# Patient Record
Sex: Female | Born: 1950 | Race: White | Hispanic: No | Marital: Single
Health system: Southern US, Community
[De-identification: ages and names within clinical notes are randomized; demographics above are authoritative.]

---

## 2017-09-06 DIAGNOSIS — G40909 Epilepsy, unspecified, not intractable, without status epilepticus: Secondary | ICD-10-CM | POA: Diagnosis not present

## 2017-09-06 DIAGNOSIS — E785 Hyperlipidemia, unspecified: Secondary | ICD-10-CM | POA: Diagnosis not present

## 2017-09-06 DIAGNOSIS — E039 Hypothyroidism, unspecified: Secondary | ICD-10-CM | POA: Diagnosis not present

## 2017-09-06 DIAGNOSIS — H6120 Impacted cerumen, unspecified ear: Secondary | ICD-10-CM | POA: Diagnosis not present

## 2017-09-06 DIAGNOSIS — Z681 Body mass index (BMI) 19 or less, adult: Secondary | ICD-10-CM | POA: Diagnosis not present

## 2017-09-06 DIAGNOSIS — Z9181 History of falling: Secondary | ICD-10-CM | POA: Diagnosis not present

## 2017-09-13 DIAGNOSIS — E039 Hypothyroidism, unspecified: Secondary | ICD-10-CM | POA: Diagnosis not present

## 2017-09-13 DIAGNOSIS — G40909 Epilepsy, unspecified, not intractable, without status epilepticus: Secondary | ICD-10-CM | POA: Diagnosis not present

## 2017-09-13 DIAGNOSIS — E785 Hyperlipidemia, unspecified: Secondary | ICD-10-CM | POA: Diagnosis not present

## 2017-10-12 DIAGNOSIS — Z1339 Encounter for screening examination for other mental health and behavioral disorders: Secondary | ICD-10-CM | POA: Diagnosis not present

## 2017-10-12 DIAGNOSIS — Z9181 History of falling: Secondary | ICD-10-CM | POA: Diagnosis not present

## 2017-10-12 DIAGNOSIS — Z139 Encounter for screening, unspecified: Secondary | ICD-10-CM | POA: Diagnosis not present

## 2017-10-12 DIAGNOSIS — E785 Hyperlipidemia, unspecified: Secondary | ICD-10-CM | POA: Diagnosis not present

## 2017-10-12 DIAGNOSIS — Z0000 Encounter for general adult medical examination without abnormal findings: Secondary | ICD-10-CM | POA: Diagnosis not present

## 2018-04-10 DIAGNOSIS — J209 Acute bronchitis, unspecified: Secondary | ICD-10-CM | POA: Diagnosis not present

## 2018-05-22 DIAGNOSIS — J209 Acute bronchitis, unspecified: Secondary | ICD-10-CM | POA: Diagnosis not present

## 2018-05-22 DIAGNOSIS — R079 Chest pain, unspecified: Secondary | ICD-10-CM | POA: Diagnosis not present

## 2018-05-22 DIAGNOSIS — R0602 Shortness of breath: Secondary | ICD-10-CM | POA: Diagnosis not present

## 2018-05-22 DIAGNOSIS — J440 Chronic obstructive pulmonary disease with acute lower respiratory infection: Secondary | ICD-10-CM | POA: Diagnosis not present

## 2018-05-22 DIAGNOSIS — F17210 Nicotine dependence, cigarettes, uncomplicated: Secondary | ICD-10-CM | POA: Diagnosis not present

## 2018-05-29 DIAGNOSIS — Z6820 Body mass index (BMI) 20.0-20.9, adult: Secondary | ICD-10-CM | POA: Diagnosis not present

## 2018-05-29 DIAGNOSIS — E782 Mixed hyperlipidemia: Secondary | ICD-10-CM | POA: Diagnosis not present

## 2018-05-29 DIAGNOSIS — J209 Acute bronchitis, unspecified: Secondary | ICD-10-CM | POA: Diagnosis not present

## 2018-10-24 DIAGNOSIS — Z139 Encounter for screening, unspecified: Secondary | ICD-10-CM | POA: Diagnosis not present

## 2018-10-24 DIAGNOSIS — Z1331 Encounter for screening for depression: Secondary | ICD-10-CM | POA: Diagnosis not present

## 2018-10-24 DIAGNOSIS — E785 Hyperlipidemia, unspecified: Secondary | ICD-10-CM | POA: Diagnosis not present

## 2018-10-24 DIAGNOSIS — Z0000 Encounter for general adult medical examination without abnormal findings: Secondary | ICD-10-CM | POA: Diagnosis not present

## 2018-10-24 DIAGNOSIS — Z9181 History of falling: Secondary | ICD-10-CM | POA: Diagnosis not present

## 2018-12-12 DIAGNOSIS — E785 Hyperlipidemia, unspecified: Secondary | ICD-10-CM | POA: Diagnosis not present

## 2018-12-12 DIAGNOSIS — G40909 Epilepsy, unspecified, not intractable, without status epilepticus: Secondary | ICD-10-CM | POA: Diagnosis not present

## 2018-12-12 DIAGNOSIS — E039 Hypothyroidism, unspecified: Secondary | ICD-10-CM | POA: Diagnosis not present

## 2019-05-20 DIAGNOSIS — R410 Disorientation, unspecified: Secondary | ICD-10-CM | POA: Diagnosis not present

## 2019-05-20 DIAGNOSIS — G9341 Metabolic encephalopathy: Secondary | ICD-10-CM | POA: Diagnosis not present

## 2019-05-20 DIAGNOSIS — R627 Adult failure to thrive: Secondary | ICD-10-CM | POA: Diagnosis not present

## 2019-05-20 DIAGNOSIS — R5383 Other fatigue: Secondary | ICD-10-CM | POA: Diagnosis not present

## 2019-05-20 DIAGNOSIS — E039 Hypothyroidism, unspecified: Secondary | ICD-10-CM | POA: Diagnosis not present

## 2019-05-20 DIAGNOSIS — E440 Moderate protein-calorie malnutrition: Secondary | ICD-10-CM | POA: Diagnosis not present

## 2019-05-20 DIAGNOSIS — R4182 Altered mental status, unspecified: Secondary | ICD-10-CM | POA: Diagnosis not present

## 2019-05-20 DIAGNOSIS — J9601 Acute respiratory failure with hypoxia: Secondary | ICD-10-CM | POA: Diagnosis not present

## 2019-05-20 DIAGNOSIS — E860 Dehydration: Secondary | ICD-10-CM | POA: Diagnosis not present

## 2019-05-20 DIAGNOSIS — R569 Unspecified convulsions: Secondary | ICD-10-CM | POA: Diagnosis not present

## 2019-05-20 DIAGNOSIS — J9811 Atelectasis: Secondary | ICD-10-CM | POA: Diagnosis not present

## 2019-05-20 DIAGNOSIS — J9602 Acute respiratory failure with hypercapnia: Secondary | ICD-10-CM | POA: Diagnosis not present

## 2019-05-20 DIAGNOSIS — R531 Weakness: Secondary | ICD-10-CM | POA: Diagnosis not present

## 2019-05-20 DIAGNOSIS — J90 Pleural effusion, not elsewhere classified: Secondary | ICD-10-CM | POA: Diagnosis not present

## 2019-05-21 DIAGNOSIS — E039 Hypothyroidism, unspecified: Secondary | ICD-10-CM | POA: Diagnosis not present

## 2019-05-21 DIAGNOSIS — R531 Weakness: Secondary | ICD-10-CM | POA: Diagnosis not present

## 2019-05-21 DIAGNOSIS — E860 Dehydration: Secondary | ICD-10-CM | POA: Diagnosis not present

## 2019-05-21 DIAGNOSIS — R569 Unspecified convulsions: Secondary | ICD-10-CM | POA: Diagnosis not present

## 2019-05-22 DIAGNOSIS — R531 Weakness: Secondary | ICD-10-CM | POA: Diagnosis not present

## 2019-05-22 DIAGNOSIS — E860 Dehydration: Secondary | ICD-10-CM | POA: Diagnosis not present

## 2019-05-22 DIAGNOSIS — R569 Unspecified convulsions: Secondary | ICD-10-CM | POA: Diagnosis not present

## 2019-05-22 DIAGNOSIS — E039 Hypothyroidism, unspecified: Secondary | ICD-10-CM | POA: Diagnosis not present

## 2019-05-23 DIAGNOSIS — E860 Dehydration: Secondary | ICD-10-CM | POA: Diagnosis not present

## 2019-05-23 DIAGNOSIS — R569 Unspecified convulsions: Secondary | ICD-10-CM | POA: Diagnosis not present

## 2019-05-23 DIAGNOSIS — R531 Weakness: Secondary | ICD-10-CM | POA: Diagnosis not present

## 2019-05-23 DIAGNOSIS — E039 Hypothyroidism, unspecified: Secondary | ICD-10-CM | POA: Diagnosis not present

## 2019-05-24 ENCOUNTER — Inpatient Hospital Stay (HOSPITAL_COMMUNITY): Payer: Medicare PPO

## 2019-05-24 ENCOUNTER — Inpatient Hospital Stay (HOSPITAL_COMMUNITY)
Admission: EM | Admit: 2019-05-24 | Discharge: 2019-05-30 | DRG: 871 | Disposition: A | Discharge status: Hospice | Payer: Medicare PPO | Source: Other Acute Inpatient Hospital | Attending: Emergency Medicine | Admitting: Emergency Medicine

## 2019-05-24 DIAGNOSIS — Z515 Encounter for palliative care: Secondary | ICD-10-CM | POA: Diagnosis not present

## 2019-05-24 DIAGNOSIS — Z66 Do not resuscitate: Secondary | ICD-10-CM | POA: Diagnosis present

## 2019-05-24 DIAGNOSIS — I10 Essential (primary) hypertension: Secondary | ICD-10-CM | POA: Diagnosis present

## 2019-05-24 DIAGNOSIS — E46 Unspecified protein-calorie malnutrition: Secondary | ICD-10-CM | POA: Diagnosis present

## 2019-05-24 DIAGNOSIS — Z820 Family history of epilepsy and other diseases of the nervous system: Secondary | ICD-10-CM | POA: Diagnosis not present

## 2019-05-24 DIAGNOSIS — A419 Sepsis, unspecified organism: Principal | ICD-10-CM | POA: Diagnosis present

## 2019-05-24 DIAGNOSIS — R251 Tremor, unspecified: Secondary | ICD-10-CM | POA: Diagnosis not present

## 2019-05-24 DIAGNOSIS — E43 Unspecified severe protein-calorie malnutrition: Secondary | ICD-10-CM | POA: Diagnosis present

## 2019-05-24 DIAGNOSIS — L8990 Pressure ulcer of unspecified site, unspecified stage: Secondary | ICD-10-CM | POA: Insufficient documentation

## 2019-05-24 DIAGNOSIS — R918 Other nonspecific abnormal finding of lung field: Secondary | ICD-10-CM | POA: Diagnosis not present

## 2019-05-24 DIAGNOSIS — G40009 Localization-related (focal) (partial) idiopathic epilepsy and epileptic syndromes with seizures of localized onset, not intractable, without status epilepticus: Secondary | ICD-10-CM | POA: Diagnosis not present

## 2019-05-24 DIAGNOSIS — Z9911 Dependence on respirator [ventilator] status: Secondary | ICD-10-CM | POA: Diagnosis not present

## 2019-05-24 DIAGNOSIS — R6521 Severe sepsis with septic shock: Secondary | ICD-10-CM | POA: Diagnosis present

## 2019-05-24 DIAGNOSIS — M1990 Unspecified osteoarthritis, unspecified site: Secondary | ICD-10-CM | POA: Diagnosis present

## 2019-05-24 DIAGNOSIS — J690 Pneumonitis due to inhalation of food and vomit: Secondary | ICD-10-CM | POA: Diagnosis present

## 2019-05-24 DIAGNOSIS — J984 Other disorders of lung: Secondary | ICD-10-CM | POA: Diagnosis not present

## 2019-05-24 DIAGNOSIS — G92 Toxic encephalopathy: Secondary | ICD-10-CM | POA: Diagnosis not present

## 2019-05-24 DIAGNOSIS — G40209 Localization-related (focal) (partial) symptomatic epilepsy and epileptic syndromes with complex partial seizures, not intractable, without status epilepticus: Secondary | ICD-10-CM | POA: Diagnosis not present

## 2019-05-24 DIAGNOSIS — Z79890 Hormone replacement therapy (postmenopausal): Secondary | ICD-10-CM | POA: Diagnosis not present

## 2019-05-24 DIAGNOSIS — E872 Acidosis: Secondary | ICD-10-CM | POA: Diagnosis present

## 2019-05-24 DIAGNOSIS — L89151 Pressure ulcer of sacral region, stage 1: Secondary | ICD-10-CM | POA: Diagnosis present

## 2019-05-24 DIAGNOSIS — J189 Pneumonia, unspecified organism: Secondary | ICD-10-CM | POA: Diagnosis not present

## 2019-05-24 DIAGNOSIS — R627 Adult failure to thrive: Secondary | ICD-10-CM | POA: Diagnosis present

## 2019-05-24 DIAGNOSIS — J45909 Unspecified asthma, uncomplicated: Secondary | ICD-10-CM | POA: Diagnosis present

## 2019-05-24 DIAGNOSIS — E162 Hypoglycemia, unspecified: Secondary | ICD-10-CM | POA: Diagnosis not present

## 2019-05-24 DIAGNOSIS — R531 Weakness: Secondary | ICD-10-CM | POA: Diagnosis not present

## 2019-05-24 DIAGNOSIS — D696 Thrombocytopenia, unspecified: Secondary | ICD-10-CM | POA: Diagnosis present

## 2019-05-24 DIAGNOSIS — R4182 Altered mental status, unspecified: Secondary | ICD-10-CM | POA: Diagnosis not present

## 2019-05-24 DIAGNOSIS — I959 Hypotension, unspecified: Secondary | ICD-10-CM | POA: Diagnosis not present

## 2019-05-24 DIAGNOSIS — J9601 Acute respiratory failure with hypoxia: Secondary | ICD-10-CM | POA: Diagnosis not present

## 2019-05-24 DIAGNOSIS — J9600 Acute respiratory failure, unspecified whether with hypoxia or hypercapnia: Secondary | ICD-10-CM | POA: Diagnosis not present

## 2019-05-24 DIAGNOSIS — G20 Parkinson's disease: Secondary | ICD-10-CM | POA: Diagnosis present

## 2019-05-24 DIAGNOSIS — F0280 Dementia in other diseases classified elsewhere without behavioral disturbance: Secondary | ICD-10-CM | POA: Diagnosis present

## 2019-05-24 DIAGNOSIS — R0902 Hypoxemia: Secondary | ICD-10-CM

## 2019-05-24 DIAGNOSIS — E039 Hypothyroidism, unspecified: Secondary | ICD-10-CM | POA: Diagnosis not present

## 2019-05-24 DIAGNOSIS — Z6821 Body mass index (BMI) 21.0-21.9, adult: Secondary | ICD-10-CM | POA: Diagnosis not present

## 2019-05-24 DIAGNOSIS — E8770 Fluid overload, unspecified: Secondary | ICD-10-CM | POA: Diagnosis present

## 2019-05-24 DIAGNOSIS — J9690 Respiratory failure, unspecified, unspecified whether with hypoxia or hypercapnia: Secondary | ICD-10-CM

## 2019-05-24 DIAGNOSIS — E860 Dehydration: Secondary | ICD-10-CM | POA: Diagnosis not present

## 2019-05-24 DIAGNOSIS — Z4682 Encounter for fitting and adjustment of non-vascular catheter: Secondary | ICD-10-CM | POA: Diagnosis not present

## 2019-05-24 DIAGNOSIS — I4891 Unspecified atrial fibrillation: Secondary | ICD-10-CM | POA: Diagnosis not present

## 2019-05-24 DIAGNOSIS — R569 Unspecified convulsions: Secondary | ICD-10-CM | POA: Diagnosis not present

## 2019-05-24 DIAGNOSIS — F329 Major depressive disorder, single episode, unspecified: Secondary | ICD-10-CM | POA: Diagnosis present

## 2019-05-24 DIAGNOSIS — J90 Pleural effusion, not elsewhere classified: Secondary | ICD-10-CM | POA: Diagnosis not present

## 2019-05-24 DIAGNOSIS — G9340 Encephalopathy, unspecified: Secondary | ICD-10-CM | POA: Diagnosis not present

## 2019-05-24 LAB — POCT I-STAT 7, (LYTES, BLD GAS, ICA,H+H)
Acid-base deficit: 8 mmol/L — ABNORMAL HIGH (ref 0.0–2.0)
Acid-base deficit: 7 mmol/L — ABNORMAL HIGH (ref 0.0–2.0)
Acid-base deficit: 5 mmol/L — ABNORMAL HIGH (ref 0.0–2.0)
Bicarbonate: 22.2 mmol/L (ref 20.0–28.0)
Bicarbonate: 20.8 mmol/L (ref 20.0–28.0)
Bicarbonate: 20.9 mmol/L (ref 20.0–28.0)
Calcium, Ion: 1.32 mmol/L (ref 1.15–1.40)
Calcium, Ion: 1.31 mmol/L (ref 1.15–1.40)
Calcium, Ion: 1.32 mmol/L (ref 1.15–1.40)
HCT: 33 % — ABNORMAL LOW (ref 36.0–46.0)
HCT: 29 % — ABNORMAL LOW (ref 36.0–46.0)
HCT: 28 % — ABNORMAL LOW (ref 36.0–46.0)
Hemoglobin: 11.2 g/dL — ABNORMAL LOW (ref 12.0–15.0)
Hemoglobin: 9.9 g/dL — ABNORMAL LOW (ref 12.0–15.0)
Hemoglobin: 9.5 g/dL — ABNORMAL LOW (ref 12.0–15.0)
O2 Saturation: 95 %
O2 Saturation: 82 %
O2 Saturation: 90 %
Patient temperature: 37.2
Potassium: 2.8 mmol/L — ABNORMAL LOW (ref 3.5–5.1)
Potassium: 2.8 mmol/L — ABNORMAL LOW (ref 3.5–5.1)
Potassium: 3.4 mmol/L — ABNORMAL LOW (ref 3.5–5.1)
Sodium: 144 mmol/L (ref 135–145)
Sodium: 143 mmol/L (ref 135–145)
Sodium: 141 mmol/L (ref 135–145)
TCO2: 24 mmol/L (ref 22–32)
TCO2: 22 mmol/L (ref 22–32)
TCO2: 22 mmol/L (ref 22–32)
pCO2 arterial: 71.9 mmHg (ref 32.0–48.0)
pCO2 arterial: 49.9 mmHg — ABNORMAL HIGH (ref 32.0–48.0)
pCO2 arterial: 44.2 mmHg (ref 32.0–48.0)
pH, Arterial: 7.098 — CL (ref 7.350–7.450)
pH, Arterial: 7.227 — ABNORMAL LOW (ref 7.350–7.450)
pH, Arterial: 7.283 — ABNORMAL LOW (ref 7.350–7.450)
pO2, Arterial: 104 mmHg (ref 83.0–108.0)
pO2, Arterial: 56 mmHg — ABNORMAL LOW (ref 83.0–108.0)
pO2, Arterial: 67 mmHg — ABNORMAL LOW (ref 83.0–108.0)

## 2019-05-24 LAB — CBC WITH DIFFERENTIAL/PLATELET
Abs Immature Granulocytes: 0.02 10*3/uL (ref 0.00–0.07)
Basophils Absolute: 0 10*3/uL (ref 0.0–0.1)
Basophils Relative: 0 %
Eosinophils Absolute: 0 10*3/uL (ref 0.0–0.5)
Eosinophils Relative: 0 %
HCT: 36.8 % (ref 36.0–46.0)
Hemoglobin: 12 g/dL (ref 12.0–15.0)
Immature Granulocytes: 0 %
Lymphocytes Relative: 25 %
Lymphs Abs: 1.1 10*3/uL (ref 0.7–4.0)
MCH: 34.5 pg — ABNORMAL HIGH (ref 26.0–34.0)
MCHC: 32.6 g/dL (ref 30.0–36.0)
MCV: 105.7 fL — ABNORMAL HIGH (ref 80.0–100.0)
Monocytes Absolute: 1.1 10*3/uL — ABNORMAL HIGH (ref 0.1–1.0)
Monocytes Relative: 25 %
Neutro Abs: 2.2 10*3/uL (ref 1.7–7.7)
Neutrophils Relative %: 50 %
Platelets: 123 10*3/uL — ABNORMAL LOW (ref 150–400)
RBC: 3.48 MIL/uL — ABNORMAL LOW (ref 3.87–5.11)
RDW: 14.7 % (ref 11.5–15.5)
WBC: 4.5 10*3/uL (ref 4.0–10.5)
nRBC: 0.4 % — ABNORMAL HIGH (ref 0.0–0.2)

## 2019-05-24 LAB — GLUCOSE, CAPILLARY
Glucose-Capillary: 119 mg/dL — ABNORMAL HIGH (ref 70–99)
Glucose-Capillary: 53 mg/dL — ABNORMAL LOW (ref 70–99)
Glucose-Capillary: 80 mg/dL (ref 70–99)
Glucose-Capillary: 86 mg/dL (ref 70–99)

## 2019-05-24 LAB — COMPREHENSIVE METABOLIC PANEL
ALT: 12 U/L (ref 0–44)
AST: 17 U/L (ref 15–41)
Albumin: 1.8 g/dL — ABNORMAL LOW (ref 3.5–5.0)
Alkaline Phosphatase: 42 U/L (ref 38–126)
Anion gap: 7 (ref 5–15)
BUN: 13 mg/dL (ref 8–23)
CO2: 23 mmol/L (ref 22–32)
Calcium: 7.9 mg/dL — ABNORMAL LOW (ref 8.9–10.3)
Chloride: 114 mmol/L — ABNORMAL HIGH (ref 98–111)
Creatinine, Ser: 0.61 mg/dL (ref 0.44–1.00)
GFR calc Af Amer: >60 mL/min (ref >60)
GFR calc non Af Amer: >60 mL/min (ref >60)
Glucose, Bld: 93 mg/dL (ref 70–99)
Potassium: 3 mmol/L — ABNORMAL LOW (ref 3.5–5.1)
Sodium: 144 mmol/L (ref 135–145)
Total Bilirubin: 0.6 mg/dL (ref 0.3–1.2)
Total Protein: 3.8 g/dL — ABNORMAL LOW (ref 6.5–8.1)

## 2019-05-24 LAB — HIV ANTIBODY (ROUTINE TESTING W REFLEX): HIV Screen 4th Generation wRfx: NONREACTIVE

## 2019-05-24 LAB — PHOSPHORUS: Phosphorus: 3.7 mg/dL (ref 2.5–4.6)

## 2019-05-24 LAB — MAGNESIUM: Magnesium: 1.5 mg/dL — ABNORMAL LOW (ref 1.7–2.4)

## 2019-05-24 LAB — VITAMIN B12: Vitamin B-12: 655 pg/mL (ref 180–914)

## 2019-05-24 LAB — CORTISOL: Cortisol, Plasma: 49.2 ug/dL

## 2019-05-24 LAB — LACTIC ACID, PLASMA
Lactic Acid, Venous: 2.6 mmol/L (ref 0.5–1.9)
Lactic Acid, Venous: 3.8 mmol/L (ref 0.5–1.9)

## 2019-05-24 LAB — PROCALCITONIN: Procalcitonin: 3.3 ng/mL

## 2019-05-24 LAB — C-REACTIVE PROTEIN: CRP: 7.2 mg/dL — ABNORMAL HIGH (ref <1.0)

## 2019-05-24 LAB — MRSA PCR SCREENING: MRSA by PCR: NEGATIVE

## 2019-05-24 MED ORDER — VALPROIC ACID 250 MG/5ML PO SOLN
500.0000 mg | Freq: Every day | ORAL | Status: DC
  Administered 2019-05-25 – 2019-05-29 (×5): 500 mg
  Filled 2019-05-24 (×5): qty 10

## 2019-05-24 MED ORDER — SODIUM CHLORIDE 0.9 % IV SOLN
2.0000 g | Freq: Two times a day (BID) | INTRAVENOUS | Status: AC
  Administered 2019-05-24 – 2019-05-29 (×10): 2 g via INTRAVENOUS
  Filled 2019-05-24 (×10): qty 2

## 2019-05-24 MED ORDER — SIMVASTATIN 20 MG PO TABS
20.0000 mg | ORAL_TABLET | Freq: Every day | ORAL | Status: DC
  Administered 2019-05-24 – 2019-05-28 (×5): 20 mg
  Filled 2019-05-24 (×6): qty 1

## 2019-05-24 MED ORDER — HEPARIN SODIUM (PORCINE) 5000 UNIT/ML IJ SOLN: 5000.0000 [IU] | Freq: Three times a day (TID) | INTRAMUSCULAR | Status: DC

## 2019-05-24 MED ORDER — VITAL HIGH PROTEIN PO LIQD: 1000.0000 mL | ORAL | Status: DC

## 2019-05-24 MED ORDER — LACTATED RINGERS IV SOLN: INTRAVENOUS | Status: DC

## 2019-05-24 MED ORDER — LEVETIRACETAM 100 MG/ML PO SOLN
500.0000 mg | Freq: Two times a day (BID) | ORAL | Status: DC
  Administered 2019-05-24 – 2019-05-29 (×10): 500 mg
  Filled 2019-05-24 (×10): qty 10

## 2019-05-24 MED ORDER — POTASSIUM CHLORIDE 10 MEQ/50ML IV SOLN
10.0000 meq | INTRAVENOUS | Status: AC
  Administered 2019-05-24 (×4): 10 meq via INTRAVENOUS
  Filled 2019-05-24 (×2): qty 50

## 2019-05-24 MED ORDER — DEXTROSE 50 % IV SOLN
INTRAVENOUS | Status: AC
  Filled 2019-05-24: qty 50

## 2019-05-24 MED ORDER — VALPROIC ACID 250 MG/5ML PO SOLN
1000.0000 mg | Freq: Every day | ORAL | Status: DC
  Administered 2019-05-24 – 2019-05-28 (×4): 1000 mg
  Filled 2019-05-24 (×4): qty 20

## 2019-05-24 MED ORDER — LACTATED RINGERS IV BOLUS
1000.0000 mL | Freq: Once | INTRAVENOUS | Status: AC
  Administered 2019-05-24: 17:00:00 1000 mL via INTRAVENOUS

## 2019-05-24 MED ORDER — PHENYLEPHRINE HCL-NACL 10-0.9 MG/250ML-% IV SOLN
INTRAVENOUS | Status: AC
  Filled 2019-05-24: qty 250

## 2019-05-24 MED ORDER — VASOPRESSIN 20 UNIT/ML IV SOLN
0.0300 [IU]/min | INTRAVENOUS | Status: DC
  Administered 2019-05-24: 0.03 [IU]/min via INTRAVENOUS
  Filled 2019-05-24 (×2): qty 2

## 2019-05-24 MED ORDER — DEXTROSE 50 % IV SOLN
1.0000 | Freq: Once | INTRAVENOUS | Status: AC
  Administered 2019-05-24: 23:00:00 50 mL via INTRAVENOUS

## 2019-05-24 MED ORDER — GABAPENTIN 250 MG/5ML PO SOLN
600.0000 mg | Freq: Two times a day (BID) | ORAL | Status: DC
  Administered 2019-05-24 – 2019-05-25 (×4): 600 mg
  Filled 2019-05-24 (×5): qty 12

## 2019-05-24 MED ORDER — NOREPINEPHRINE 16 MG/250ML-% IV SOLN
5.0000 ug/min | INTRAVENOUS | Status: DC
  Administered 2019-05-24: 16:00:00 5 ug/min via INTRAVENOUS
  Administered 2019-05-25: 10 ug/min via INTRAVENOUS
  Filled 2019-05-24 (×2): qty 250

## 2019-05-24 MED ORDER — VITAL AF 1.2 CAL PO LIQD
1000.0000 mL | ORAL | Status: DC
  Administered 2019-05-24 – 2019-05-27 (×4): 1000 mL

## 2019-05-24 MED ORDER — ORAL CARE MOUTH RINSE
15.0000 mL | OROMUCOSAL | Status: DC
  Administered 2019-05-24 – 2019-05-27 (×31): 15 mL via OROMUCOSAL

## 2019-05-24 MED ORDER — ADULT MULTIVITAMIN LIQUID CH
15.0000 mL | Freq: Every day | ORAL | Status: DC
  Administered 2019-05-24 – 2019-05-29 (×6): 15 mL
  Filled 2019-05-24 (×6): qty 15

## 2019-05-24 MED ORDER — LEVOTHYROXINE SODIUM 25 MCG PO TABS
25.0000 ug | ORAL_TABLET | Freq: Every day | ORAL | Status: DC
  Administered 2019-05-25 – 2019-05-29 (×5): 25 ug
  Filled 2019-05-24 (×5): qty 1

## 2019-05-24 MED ORDER — PRO-STAT SUGAR FREE PO LIQD: 30.0000 mL | Freq: Two times a day (BID) | ORAL | Status: DC

## 2019-05-24 MED ORDER — IPRATROPIUM-ALBUTEROL 0.5-2.5 (3) MG/3ML IN SOLN
3.0000 mL | Freq: Four times a day (QID) | RESPIRATORY_TRACT | Status: DC
  Administered 2019-05-24 – 2019-05-25 (×3): 3 mL via RESPIRATORY_TRACT
  Filled 2019-05-24 (×2): qty 3

## 2019-05-24 MED ORDER — MAGNESIUM SULFATE 2 GM/50ML IV SOLN
2.0000 g | Freq: Once | INTRAVENOUS | Status: AC
  Administered 2019-05-24: 20:00:00 2 g via INTRAVENOUS

## 2019-05-24 MED ORDER — PANTOPRAZOLE SODIUM 40 MG IV SOLR
40.0000 mg | Freq: Every day | INTRAVENOUS | Status: DC
  Administered 2019-05-24: 21:00:00 40 mg via INTRAVENOUS
  Filled 2019-05-24: qty 40

## 2019-05-24 MED ORDER — NOREPINEPHRINE 4 MG/250ML-% IV SOLN
INTRAVENOUS | Status: AC
  Filled 2019-05-24: qty 250

## 2019-05-24 MED ORDER — CHLORHEXIDINE GLUCONATE 0.12% ORAL RINSE (MEDLINE KIT)
15.0000 mL | Freq: Two times a day (BID) | OROMUCOSAL | Status: DC
  Administered 2019-05-24 – 2019-05-28 (×9): 15 mL via OROMUCOSAL

## 2019-05-24 MED ORDER — HYDROCORTISONE NA SUCCINATE PF 100 MG IJ SOLR
50.0000 mg | Freq: Four times a day (QID) | INTRAMUSCULAR | Status: AC
  Administered 2019-05-24 – 2019-05-27 (×12): 50 mg via INTRAVENOUS
  Filled 2019-05-24 (×12): qty 2

## 2019-05-24 MED ORDER — FENTANYL CITRATE (PF) 100 MCG/2ML IJ SOLN
25.0000 ug | INTRAMUSCULAR | Status: DC | PRN
  Administered 2019-05-24 – 2019-05-26 (×12): 100 ug via INTRAVENOUS
  Administered 2019-05-26: 22:00:00 50 ug via INTRAVENOUS
  Administered 2019-05-26 (×3): 100 ug via INTRAVENOUS
  Administered 2019-05-27: 04:00:00 50 ug via INTRAVENOUS
  Administered 2019-05-27: 10:00:00 100 ug via INTRAVENOUS
  Filled 2019-05-24 (×19): qty 2

## 2019-05-24 MED ORDER — VANCOMYCIN HCL IN DEXTROSE 1-5 GM/200ML-% IV SOLN
1000.0000 mg | INTRAVENOUS | Status: DC
  Administered 2019-05-24: 1000 mg via INTRAVENOUS
  Filled 2019-05-24: qty 200

## 2019-05-24 MED ORDER — THIAMINE HCL 100 MG/ML IJ SOLN
100.0000 mg | Freq: Every day | INTRAMUSCULAR | Status: DC
  Administered 2019-05-24 – 2019-05-28 (×5): 100 mg via INTRAVENOUS
  Filled 2019-05-24 (×5): qty 2

## 2019-05-24 MED ORDER — FENTANYL CITRATE (PF) 100 MCG/2ML IJ SOLN: 25.0000 ug | INTRAMUSCULAR | Status: DC | PRN

## 2019-05-24 NOTE — Progress Notes (Signed)
Next of kin is Elliot Dally who is her brother.  I was able to contact him via cell phone at [phone], his home phone is listed as [phone].  We discussed the patient's overall health, it does sound as though she has had over a years decline in health, weight, as well as cognitive function, but this is more rapidly declined over the last 2 weeks leading to her hospitalization.  Also learned that her father had dementia.  I discussed with Link Snuffer and his spouse over the phone are current findings, plan of care, and potential therapies.  We also discussed goals of care as far as providing CPR should she suffer a cardiac arrest.  I have recommended and they are in agreement that should Ms. Robin suffer cardiac arrest we would not pursue CPR or ACLS interventions.  Plan Continue care as outlined in admission history and physical Continue mechanical ventilation, antibiotics, vasoactive drip support Should she suffer a cardiac arrest we would not initiate CPR or defibrillation, she is a DO NOT RESUSCITATE should she suffer cardiac arrest   Simonne Martinet ACNP-BC [facility] Pulmonary/Critical Care Pager # [phone] OR # [phone] if no answer

## 2019-05-24 NOTE — Progress Notes (Signed)
Hypoglycemic Event  CBG: 53  Treatment: 1 amp of D50   Symptoms: None   Follow-up CBG: Time: 23:40 CBG Result:119  Possible Reasons for Event: Failure to thrive.    Barbara Villanueva

## 2019-05-24 NOTE — Progress Notes (Signed)
EEG complete - results pending 

## 2019-05-24 NOTE — Progress Notes (Signed)
ETT was advanced from 22 to 24 at the lip per MD verbal order. No complications were noted with change.

## 2019-05-24 NOTE — Procedures (Signed)
Patient Name: Barbara Villanueva  MRN: [id number]  Epilepsy Attending: Charlsie Quest  Referring Physician/Provider: Zenia Resides, NP Date: 05/24/2019 Duration: 23.52 mins  Patient history: 69yo F with h/o seizures who presented with ams. EEG to evaluate for seizure  Level of alertness: comatose  AEDs during EEG study: LEV, VPA, GBP  Technical aspects: This EEG study was done with scalp electrodes positioned according to the 10-20 International system of electrode placement. Electrical activity was acquired at a sampling rate of 500Hz  and reviewed with a high frequency filter of 70Hz  and a low frequency filter of 1Hz . EEG data were recorded continuously and digitally stored.   DESCRIPTION: EEG showed continuous generalized 5-7Hz  theta slowing as well as intermittent generalized 2-3Hz  delta slowing. EEG was reactive to tactile stimulation.  Hyperventilation and photic stimulation were not performed.  ABNORMALITY - Continuous slow, generalized   IMPRESSION: This study is suggestive of moderate to severe diffuse encephalopathy, non specific to etiology.  No seizures or epileptiform discharges were seen throughout the recording.  Ladislav Caselli 

## 2019-05-24 NOTE — Progress Notes (Signed)
Pharmacy Antibiotic Note  Barbara Villanueva is a 69 y.o. female admitted to [facility] with failure to thrive, then decompensated on 05/23/19 requiring intubation.  Patient transfers to [facility] on 05/24/2019 with hypotension and shock.  She received Rocephin and azithromycin prior to transfer.  Pharmacy has been consulted to broaden antibiotics to vancomycin and cefepime for HAP.  Vanc 3/18 >> Cefepime 3/18 >>  Info from [place]: Azith x1 3/18 around 0400 CTX x1 3/18 around 0400  SCr 0.4, CrCL ~ 50 ml/min using SCr 0.8 3/17 covid - negative Weight = 47.2 kg  Plan: Vanc 1gm IV Q24H for AUC 480 using SCr 0.8 Cefepime 2gm IV Q12H Monitor renal fxn, clinical progress, vanc AUC as indicated  Height: 5\' 4"  (162.6 cm) Weight: 104 lb 0.9 oz (47.2 kg) IBW/kg (Calculated) : 54.7  No data recorded.  No results for input(s): WBC, CREATININE, LATICACIDVEN, VANCOTROUGH, VANCOPEAK, VANCORANDOM, GENTTROUGH, GENTPEAK, GENTRANDOM, TOBRATROUGH, TOBRAPEAK, TOBRARND, AMIKACINPEAK, AMIKACINTROU, AMIKACIN in the last 168 hours.  CrCl cannot be calculated (No successful lab value found.).    Not on File  Barbara Villanueva D. 04-14-1982, PharmD, BCPS, BCCCP 05/24/2019, 2:51 PM

## 2019-05-24 NOTE — Progress Notes (Signed)
Initial Nutrition Assessment  DOCUMENTATION CODES:   Not applicable  INTERVENTION:    Vital AF 1.2 at 20 ml/h increase by 10 ml every 12 hours to goal rate of 40 ml/h (960 ml per day)   Provides 1152 kcal, 72 gm protein, 779 ml free water daily   Monitor magnesium, potassium, and phosphorus, MD to replete as needed, as pt is at risk for refeeding syndrome given minimal intake for the past 2 weeks, suspected malnutrition.  NUTRITION DIAGNOSIS:   Inadequate oral intake related to inability to eat as evidenced by NPO status.  GOAL:   Patient will meet greater than or equal to 90% of their needs  MONITOR:   Vent status, TF tolerance, Labs, Skin  REASON FOR ASSESSMENT:   Ventilator, Consult Enteral/tube feeding initiation and management  ASSESSMENT:   69 yo female admitted with acute respiratory distress, L hemithorax, requiring intubation. PMH includes hypothyroidism, HTN, seizure D/O.   Patient had been admitted to [facility] on 3/14 with 2 weeks of generalized FTT, including worsening anorexia and decreased PO intake. She developed respiratory distress and was transferred to [facility] before she could be discharged to SNF.  Received MD Consult for TF initiation and management. Patient is currently intubated on ventilator support MV: 6.2 L/min No temp available  Labs pending.  CBG: 80  Medications reviewed and include MVI, thiamine, Levophed, Neosynephrine, Pitressin.  No weight history available. BMI is on the low end of normal range, especially for her age.  With recent poor intake x 2 weeks, suspect she is malnourished. Unable to obtain enough information at this time for identification of malnutrition.   NUTRITION - FOCUSED PHYSICAL EXAM:  unable to complete  Diet Order:   Diet Order            Diet NPO time specified  Diet effective now              EDUCATION NEEDS:   Not appropriate for education at this time  Skin:  Skin Assessment:  Reviewed RN Assessment  Last BM:  no BM documented  Height:   Ht Readings from Last 1 Encounters:  05/24/19 5\' 4"  (1.626 m)    Weight:   Wt Readings from Last 1 Encounters:  05/24/19 47.2 kg    Ideal Body Weight:  54.5 kg  BMI:  Body mass index is 17.86 kg/m.  Estimated Nutritional Needs:   Kcal:  1115  Protein:  70-80 gm  Fluid:  1.5 L    05/26/19, RD, LDN, CNSC Please refer to Amion for contact information.

## 2019-05-24 NOTE — Progress Notes (Signed)
Sputum sample collected and sent to the lab.  

## 2019-05-24 NOTE — Progress Notes (Addendum)
eLink Physician-Brief Progress Note Patient Name: Barbara Villanueva DOB: [birth year] MRN: [id number]   Date of Service  05/24/2019  HPI/Events of Note  Patient is hypotensive (SBP mid 80s) on levophed 15 mcg/min. Has evidence of fluid overload on CXR and is currently on PEEP 8, FiO2 0.8 so may not have much additional room for fluid boluses though it's difficult to tell since they are also satting in high 90s on this. Is currently on LR at 250cc/hr. Not on steroids. Is on ABX (vanc/cefepime) for septic shock.   eICU Interventions  Adjust LR infusion rate to 100cc/hr.  RT to determine actual FiO2 requirements so we can better gauge whether additional fluid will be tolerated. RN will call back with this data as soon as obtained.  Start steroids (hydrocort 50mg  Q6H) for refractory shock.    ADDENDUM:  Spoke to RT. Will obtain ABG to verify PaO2 first. Will also help ensure acidosis is not worsening as contributor to hypotension, and check iCal on ABG.  ADDENDUM:  ABG did confirm the patient is requiring PEEP 8 and FiO2 0.8 to obtain a PaO2 of 67 mmHg. There was some residual acidosis with pH 7.28. We increased RR from 26 to 30 to attempt to address this. Repeat ABG 2 hours later shows no significant change in either direction. Plan is currently to maintain current settings.   Intervention Category Major Interventions: Hypotension - evaluation and management  05/24/2019, 8:23 PM

## 2019-05-24 NOTE — H&P (Signed)
NAME:  Barbara Villanueva, MRN:  [id number], DOB:  August 11, 1950, LOS: 0 ADMISSION DATE:  05/24/2019, CONSULTATION DATE: 3/18 REFERRING MD: 3/18, CHIEF COMPLAINT: [facility]  Brief History   69 year old female with history of hypothyroidism, hypertension, seizure disorder.  Admitted with generalized failure to thrive over approximately 2 weeks timeframe on 3/14.  Had been making improvements and was to be transferred to skilled nursing facility on 318, however early that morning developed acute respiratory distress with complete opacification of left hemithorax resulting in need for intubation, was hypotensive post intubation.  Transferred to [facility] for further ration and treatment  History of present illness   69 year old chronically ill-appearing female with below listed comorbidities was brought to the [facility] emergency room on 3/14 by her brother with report of 2-week history of worsening anorexia, decreased p.o. intake, fatigue, increased somnolence and worsening confusion.  In the emergency room she was afebrile, sodium 141, normal BUN and creatinine.  A CT of head was obtained this was unremarkable.  She is admitted for evaluation of acute encephalopathy.  And just generalized failure to thrive therapeutic interventions have included primarily IV fluid and supportive care in addition to further neurologic evaluation which had been negative to date.  She was accidentally to be transferred to skilled nursing facility on 3/18 however overnight/in the early a.m. hours of 3/18 developed acute hypoxia, worsening work of breathing, and chest x-ray with complete opacification of the left hemithorax, she was intubated with a working diagnosis of presumed aspiration event with secondary progressive circulatory shock.  She was transferred to [facility] for further evaluation.  Past Medical History  Hypothyroidism, hypertension, seizure disorder.  Significant Hospital Events    Admitted to [facility] 3/14, initial working diagnosis of metabolic encephalopathy, failure to thrive, hypothyroidism, weakness, and dehydration as well as protein calorie malnutrition 3/14 through 3/17: Receiving IV fluids, supportive care, extensive diagnostic evaluation without significant positive diagnostic findings specifically negative CT brain, no acute findings on MRI brain, B12, folate, and TSH all within normal limits no evidence of seizure, no evidence of acute infection.  She had made some moderate improvement, and was to be transferred to skilled nursing facility 3/18: Developed acute respiratory failure overnight, acute left-sided opacification of the left hemithorax with associated respiratory failure requiring intubation, complicated by circulatory shock, central venous line placed, initiated on vasoactive drips.  Transferred to [facility]. Consults:  None  Procedures:  Oral endotracheal tube 3/18:([place])>>> Right IJ triple-lumen catheter 3/18 ([place])>>> Significant Diagnostic Tests:  CT brain 3/14: Negative for CVA MRI brain at [facility] 3/17: Motion artifact but no evidence of recent infarction, hemorrhage or mass minor chronic microvascular changes Micro Data:  Respiratory culture 3/18 Urine culture 3/18 Blood culture 3/18 COVID-19 3/17: Negative completed at [facility] and confirmed  Antimicrobials:  Ceftriaxone and a azithromycin x1 dose 3/18 Cefepime 3/18>>> Vancomycin 3/18>>  Interim history/subjective:  She has just arrived to the intensive care via transport from [facility] she is unresponsive on propofol infusion, on both norepinephrine and Neo-Synephrine infusions.  Objective   Height 5\' 4"  (1.626 m), weight 47.2 kg, SpO2 100 %.    Vent Mode: PRVC FiO2 (%):  [100 %] 100 % Set Rate:  [16 bmp] 16 bmp Vt Set:  [430 mL] 430 mL PEEP:  [5 cmH20] 5 cmH20 Plateau Pressure:  [14 cmH20] 14 cmH20  No intake or output data in the 24 hours ending 05/24/19  1452 Filed Weights   05/24/19 1420  Weight: 47.2 kg  Examination: General: Chronically ill-appearing 69 year old female who appears much older than stated age, she is frail, malnourished, mottled, with multiple areas of bruising HENT: Normocephalic atraumatic no jugular venous distention pupils pinpoint but reactive orally intubated no JVD Lungs: Equal chest rise bilaterally diminished breath sounds bibasilar no wheezing currently on 100% FiO2 PEEP of 5 Cardiovascular: Tachycardic regular rhythm no murmur rub or gallop Abdomen: Soft nontender no organomegaly Extremities: Cool, mottled, pulses weak, trace lower extremity edema, multiple areas of ecchymosis over upper extremities and over lower back Neuro: Heavily sedated and not responding to pain GU: Concentrated yellow urine  Resolved Hospital Problem list    Assessment & Plan:  Acute hypoxic respiratory failure in the setting of presumed aspiration pneumonia versus HCAP Portable chest x-ray pending, preliminarily was reported to have almost complete opacification of the left hemithorax, this improved post intubation Plan Continuing full ventilatory support, titrating PEEP and FiO2 for goal PaO2 greater than 65 and saturation greater than 88% Goal plateau pressure less than 30 Adding scheduled bronchodilators Repeating portable chest x-ray post transfer and to follow-up left-sided airspace disease We will send sputum for culture Initiate broad-spectrum nosocomial coverage with cefepime and vancomycin Sedation protocol, currently oversedated, will place her on as needed fentanyl initially and discontinue propofol RASS goal 0 to -1 VAP bundle Follow-up a.m. chest x-ray  Severe sepsis septic shock Presumed secondary to aspiration pneumonia/HCAP -Currently mottled and appears underperfused Plan Repeat chest x-ray and verify central line placement, once confirmed will check central venous pressure, goal CVP 8-12 Titrate  norepinephrine for mean arterial pressure greater than 65, also adding vasopressin at shock dose Check cortisol Blood culture and urine culture Hold any and all antihypertensives Cycling cardiac enzymes Consider echocardiogram if does not improve over the next 24 hours   Acute toxic/metabolic encephalopathy.  Review of medical records from outside hospital suggest this had slightly improved with IV hydration and residual primary problem was failure to thrive, and what sounds like perhaps even depression. Now obtunded, suspect mostly secondary to sedation. Plan Discontinuing propofol As needed fentanyl with RASS goal as mentioned above EEG given history of seizure Serial neuro assessments Supportive care Repeating metabolic evaluation via lab Need to discuss with family Baseline functional and neurological/cognitive status prior to hospitalization  History of seizure disorder Plan Checking EEG Continue Depakote, Neurontin, and Keppra.  I do not see she had been receiving the Keppra at [place]  History of hypothyroidism Reported normal TSH but cannot find these labs Plan Repeat TSH Continue current Synthroid dosing at 25 mcg a day  Severe protein calorie malnutrition Plan Nutritional consult, start tube feeds  Chronic thrombocytopenia Platelet count been holding in the 60s Plan Repeat CBC Continue PAS hose, hold chemical prophylaxis for now  Stage I pressure ulcer with discoloration over coccyx and sacrum Plan Air overlay mattress Maximize nutritional status Frequent repositioning Wound ostomy eval  Best practice:  Diet: start tubefeeds 3/18   Pain/Anxiety/Delirium protocol (if indicated):3/18 RASS goal 0 VAP protocol (if indicated): 3/18 DVT prophylaxis: SCD GI prophylaxis: PPI Glucose control: ssi Mobility: Bedrest  Code Status: full code  Family Communication: pending  Disposition:   Labs   CBC: No results for input(s): WBC, NEUTROABS, HGB, HCT, MCV, PLT  in the last 168 hours.  Basic Metabolic Panel: No results for input(s): NA, K, CL, CO2, GLUCOSE, BUN, CREATININE, CALCIUM, MG, PHOS in the last 168 hours. GFR: CrCl cannot be calculated (No successful lab value found.). No results for input(s): PROCALCITON, WBC, LATICACIDVEN in the  last 168 hours.  Liver Function Tests: No results for input(s): AST, ALT, ALKPHOS, BILITOT, PROT, ALBUMIN in the last 168 hours. No results for input(s): LIPASE, AMYLASE in the last 168 hours. No results for input(s): AMMONIA in the last 168 hours.  ABG No results found for: PHART, PCO2ART, PO2ART, HCO3, TCO2, ACIDBASEDEF, O2SAT   Coagulation Profile: No results for input(s): INR, PROTIME in the last 168 hours.  Cardiac Enzymes: No results for input(s): CKTOTAL, CKMB, CKMBINDEX, TROPONINI in the last 168 hours.  HbA1C: No results found for: HGBA1C  CBG: No results for input(s): GLUCAP in the last 168 hours.  Review of Systems:   ICU  Past Medical History  She, has a history of asthma, pneumonia, arthritis, hypothyroidism, depression, and seizure disorder.  Surgical History   None  Social History     Currently lives with his sister Family History   Her family history is not on file.   Allergies No known drug allergies  Home Medications  Prior to Admission medications   Depakote 1000 mg nightly Depakote 500 mg daily Gabapentin 600 mg twice daily Levothyroxine 25 mcg daily Simvastatin 20 mg daily Trazodone 50 mg nightly. Keppra 500 mg twice daily     Critical care time: 40 minutes.      Simonne Martinet ACNP-BC [facility] Pulmonary/Critical Care Pager # [phone] OR # [phone] if no answer

## 2019-05-25 ENCOUNTER — Inpatient Hospital Stay (HOSPITAL_COMMUNITY): Payer: Medicare PPO

## 2019-05-25 DIAGNOSIS — L8990 Pressure ulcer of unspecified site, unspecified stage: Secondary | ICD-10-CM | POA: Insufficient documentation

## 2019-05-25 LAB — GLUCOSE, CAPILLARY
Glucose-Capillary: 170 mg/dL — ABNORMAL HIGH (ref 70–99)
Glucose-Capillary: 66 mg/dL — ABNORMAL LOW (ref 70–99)
Glucose-Capillary: 86 mg/dL (ref 70–99)
Glucose-Capillary: 110 mg/dL — ABNORMAL HIGH (ref 70–99)
Glucose-Capillary: 125 mg/dL — ABNORMAL HIGH (ref 70–99)
Glucose-Capillary: 102 mg/dL — ABNORMAL HIGH (ref 70–99)
Glucose-Capillary: 116 mg/dL — ABNORMAL HIGH (ref 70–99)

## 2019-05-25 LAB — BASIC METABOLIC PANEL
Anion gap: 8 (ref 5–15)
Anion gap: 8 (ref 5–15)
BUN: 12 mg/dL (ref 8–23)
BUN: 12 mg/dL (ref 8–23)
CO2: 22 mmol/L (ref 22–32)
CO2: 22 mmol/L (ref 22–32)
Calcium: 8 mg/dL — ABNORMAL LOW (ref 8.9–10.3)
Calcium: 8 mg/dL — ABNORMAL LOW (ref 8.9–10.3)
Chloride: 112 mmol/L — ABNORMAL HIGH (ref 98–111)
Chloride: 112 mmol/L — ABNORMAL HIGH (ref 98–111)
Creatinine, Ser: 0.5 mg/dL (ref 0.44–1.00)
Creatinine, Ser: 0.52 mg/dL (ref 0.44–1.00)
GFR calc Af Amer: >60 mL/min (ref >60)
GFR calc Af Amer: >60 mL/min (ref >60)
GFR calc non Af Amer: >60 mL/min (ref >60)
GFR calc non Af Amer: >60 mL/min (ref >60)
Glucose, Bld: 136 mg/dL — ABNORMAL HIGH (ref 70–99)
Glucose, Bld: 130 mg/dL — ABNORMAL HIGH (ref 70–99)
Potassium: 3.3 mmol/L — ABNORMAL LOW (ref 3.5–5.1)
Potassium: 3.4 mmol/L — ABNORMAL LOW (ref 3.5–5.1)
Sodium: 142 mmol/L (ref 135–145)
Sodium: 142 mmol/L (ref 135–145)

## 2019-05-25 LAB — POCT I-STAT 7, (LYTES, BLD GAS, ICA,H+H)
Acid-base deficit: 6 mmol/L — ABNORMAL HIGH (ref 0.0–2.0)
Acid-base deficit: 7 mmol/L — ABNORMAL HIGH (ref 0.0–2.0)
Bicarbonate: 19.4 mmol/L — ABNORMAL LOW (ref 20.0–28.0)
Bicarbonate: 20.6 mmol/L (ref 20.0–28.0)
Calcium, Ion: 1.32 mmol/L (ref 1.15–1.40)
Calcium, Ion: 1.33 mmol/L (ref 1.15–1.40)
HCT: 27 % — ABNORMAL LOW (ref 36.0–46.0)
HCT: 28 % — ABNORMAL LOW (ref 36.0–46.0)
Hemoglobin: 9.2 g/dL — ABNORMAL LOW (ref 12.0–15.0)
Hemoglobin: 9.5 g/dL — ABNORMAL LOW (ref 12.0–15.0)
O2 Saturation: 95 %
O2 Saturation: 97 %
Patient temperature: 36.8
Patient temperature: 37
Potassium: 3.1 mmol/L — ABNORMAL LOW (ref 3.5–5.1)
Potassium: 3.3 mmol/L — ABNORMAL LOW (ref 3.5–5.1)
Sodium: 139 mmol/L (ref 135–145)
Sodium: 142 mmol/L (ref 135–145)
TCO2: 21 mmol/L — ABNORMAL LOW (ref 22–32)
TCO2: 22 mmol/L (ref 22–32)
pCO2 arterial: 41.8 mmHg (ref 32.0–48.0)
pCO2 arterial: 44.9 mmHg (ref 32.0–48.0)
pH, Arterial: 7.271 — ABNORMAL LOW (ref 7.350–7.450)
pH, Arterial: 7.275 — ABNORMAL LOW (ref 7.350–7.450)
pO2, Arterial: 85 mmHg (ref 83.0–108.0)
pO2, Arterial: 96 mmHg (ref 83.0–108.0)

## 2019-05-25 LAB — CBC
HCT: 30.8 % — ABNORMAL LOW (ref 36.0–46.0)
Hemoglobin: 10.2 g/dL — ABNORMAL LOW (ref 12.0–15.0)
MCH: 34.2 pg — ABNORMAL HIGH (ref 26.0–34.0)
MCHC: 33.1 g/dL (ref 30.0–36.0)
MCV: 103.4 fL — ABNORMAL HIGH (ref 80.0–100.0)
Platelets: 68 10*3/uL — ABNORMAL LOW (ref 150–400)
RBC: 2.98 MIL/uL — ABNORMAL LOW (ref 3.87–5.11)
RDW: 14.6 % (ref 11.5–15.5)
WBC: 12.6 10*3/uL — ABNORMAL HIGH (ref 4.0–10.5)
nRBC: 0.2 % (ref 0.0–0.2)

## 2019-05-25 LAB — PHOSPHORUS
Phosphorus: 2 mg/dL — ABNORMAL LOW (ref 2.5–4.6)
Phosphorus: 1.9 mg/dL — ABNORMAL LOW (ref 2.5–4.6)

## 2019-05-25 LAB — MAGNESIUM
Magnesium: 1.9 mg/dL (ref 1.7–2.4)
Magnesium: 2 mg/dL (ref 1.7–2.4)

## 2019-05-25 LAB — HOMOCYSTEINE: Homocysteine: <3 umol/L (ref 0.0–17.2)

## 2019-05-25 MED ORDER — POTASSIUM & SODIUM PHOSPHATES 280-160-250 MG PO PACK
1.0000 | PACK | Freq: Three times a day (TID) | ORAL | Status: AC
  Administered 2019-05-25 (×3): 1
  Filled 2019-05-25 (×3): qty 1

## 2019-05-25 MED ORDER — DEXTROSE 50 % IV SOLN
INTRAVENOUS | Status: AC
  Administered 2019-05-25: 50 mL
  Filled 2019-05-25: qty 50

## 2019-05-25 MED ORDER — DEXTROSE IN LACTATED RINGERS 5 % IV SOLN: INTRAVENOUS | Status: DC

## 2019-05-25 MED ORDER — POTASSIUM CHLORIDE 20 MEQ/15ML (10%) PO SOLN
20.0000 meq | ORAL | Status: AC
  Administered 2019-05-25 (×2): 20 meq
  Filled 2019-05-25 (×2): qty 15

## 2019-05-25 MED ORDER — IPRATROPIUM-ALBUTEROL 0.5-2.5 (3) MG/3ML IN SOLN
3.0000 mL | Freq: Three times a day (TID) | RESPIRATORY_TRACT | Status: DC
  Administered 2019-05-25 – 2019-05-30 (×17): 3 mL via RESPIRATORY_TRACT
  Filled 2019-05-25 (×10): qty 3
  Filled 2019-05-25: qty 39
  Filled 2019-05-25 (×6): qty 3

## 2019-05-25 MED ORDER — ACETAMINOPHEN 325 MG PO TABS: 650.0000 mg | ORAL_TABLET | Freq: Four times a day (QID) | ORAL | Status: DC | PRN

## 2019-05-25 MED ORDER — CHLORHEXIDINE GLUCONATE CLOTH 2 % EX PADS
6.0000 | MEDICATED_PAD | Freq: Every day | CUTANEOUS | Status: DC
  Administered 2019-05-26 – 2019-05-29 (×5): 6 via TOPICAL

## 2019-05-25 MED ORDER — PANTOPRAZOLE SODIUM 40 MG PO PACK
40.0000 mg | PACK | Freq: Every day | ORAL | Status: DC
  Administered 2019-05-25 – 2019-05-28 (×3): 40 mg
  Filled 2019-05-25 (×5): qty 20

## 2019-05-25 MED ORDER — ACETAMINOPHEN 325 MG PO TABS
650.0000 mg | ORAL_TABLET | Freq: Four times a day (QID) | ORAL | Status: DC | PRN
  Administered 2019-05-25: 650 mg
  Filled 2019-05-25: qty 2

## 2019-05-25 NOTE — Progress Notes (Signed)
[facility] ADULT ICU REPLACEMENT PROTOCOL FOR AM LAB REPLACEMENT ONLY  The patient does apply for the [facility] Adult ICU Electrolyte Replacment Protocol based on the criteria listed below:   1. Is GFR >/= 40 ml/min? Yes.    Patient's GFR today is >60 2. Is urine output >/= 0.5 ml/kg/hr for the last 6 hours? Yes.   Patient's UOP is 3.0 ml/kg/hr 3. Is BUN < 60 mg/dL? Yes.    Patient's BUN today is 12 4. Abnormal electrolyte(s): k 3.3 5. Ordered repletion with: protocol per ng/og tube 6. If a panic level lab has been reported, has the CCM MD in charge been notified? No..   Physician:    Markus Daft A 05/25/2019 5:00 AM

## 2019-05-25 NOTE — Progress Notes (Signed)
Hypoglycemic Event  CBG: 66  Treatment: amp of D50   Symptoms: None  Follow-up CBG: Time:0300 CBG Result: 170  Possible Reasons for Event: Malnutrition   Comments/MD notified: MD notified, waiting for orders.     Barbara Villanueva

## 2019-05-25 NOTE — Consult Note (Addendum)
WOC Nurse Consult Note: Reason for Consult: Consult requested for buttocks. Wound type: Pt was noted to have a stage 1 pressure injury which was present on admission; red and does not blanch across buttocks, approx 2X6cm in patchy areas Pressure Injury POA: Yes Dressing procedure/placement/frequency: Pt is on a low airloss mattress to reduce pressure.  Foam mattress to protect from further injury. Please re-consult if further assistance is needed.  Thank-you,  Cammie Mcgee MSN, RN, CWOCN, [place], CNS [phone]

## 2019-05-25 NOTE — H&P (Addendum)
NAME:  Barbara Villanueva, MRN:  [id number], DOB:  [birth year], LOS: 1 ADMISSION DATE:  05/24/2019, CONSULTATION DATE: 3/18 REFERRING MD: 3/18, CHIEF COMPLAINT: [facility]  Brief History   69 year old female with history of hypothyroidism, hypertension, seizure disorder.  Admitted with generalized failure to thrive over approximately 2 weeks timeframe on 3/14.  Had been making improvements and was to be transferred to skilled nursing facility on 318, however early that morning developed acute respiratory distress with complete opacification of left hemithorax resulting in need for intubation, was hypotensive post intubation.  Transferred to [facility] for further ration and treatment  History of present illness   69 year old chronically ill-appearing female with below listed comorbidities was brought to the [facility] emergency room on 3/14 by her brother with report of 2-week history of worsening anorexia, decreased p.o. intake, fatigue, increased somnolence and worsening confusion.  In the emergency room she was afebrile, sodium 141, normal BUN and creatinine.  A CT of head was obtained this was unremarkable.  She is admitted for evaluation of acute encephalopathy.  And just generalized failure to thrive therapeutic interventions have included primarily IV fluid and supportive care in addition to further neurologic evaluation which had been negative to date.  She was accidentally to be transferred to skilled nursing facility on 3/18 however overnight/in the early a.m. hours of 3/18 developed acute hypoxia, worsening work of breathing, and chest x-ray with complete opacification of the left hemithorax, she was intubated with a working diagnosis of presumed aspiration event with secondary progressive circulatory shock.  She was transferred to [facility] for further evaluation.   The patient's brother revealed thatshe has had over a years decline in health, weight, as well as cognitive  function, but this is more rapidly declined over the last 2 weeks leading to her hospitalization.  There is a family history of dementia.  Past Medical History  Hypothyroidism, hypertension, seizure disorder.  Significant Hospital Events    Admitted to [facility] 3/14, initial working diagnosis of metabolic encephalopathy, failure to thrive, hypothyroidism, weakness, and dehydration as well as protein calorie malnutrition  3/14 through 3/17: Receiving IV fluids, supportive care, extensive diagnostic evaluation without significant positive diagnostic findings specifically negative CT brain, no acute findings on MRI brain, B12, folate, and TSH all within normal limits no evidence of seizure, no evidence of acute infection.  She had made some moderate improvement, and was to be transferred to skilled nursing facility  3/18: Developed acute respiratory failure overnight, acute left-sided opacification of the left hemithorax with associated respiratory failure requiring intubation, complicated by circulatory shock, central venous line placed, initiated on vasoactive drips.  Transferred to [facility].  Aggressive fluid resuscitation 3/18  Consults:  None  Procedures:  Oral endotracheal tube 3/18:([place])>>> Right IJ triple-lumen catheter 3/18 ([place])>>>  Significant Diagnostic Tests:   CT brain 3/14: Negative for CVA  MRI brain at [facility] 3/17: Motion artifact but no evidence of recent infarction, hemorrhage or mass minor chronic microvascular changes  EEG 3/18: study is suggestive of moderate to severe diffuse encephalopathy, non specific to etiology.  No seizures or epileptiform discharges were seen throughout the recording.  Micro Data:   Respiratory culture 3/18  Urine culture 3/18  Blood culture 3/18  COVID-19 3/17: Negative completed at [facility] and confirmed  Antimicrobials:  Ceftriaxone and a azithromycin x1 dose 3/18 Cefepime 3/18>>> Vancomycin 3/18>>  Interim  history/subjective:  Persistent hypotension overnight.  Started on stress dose steroids.  Source of hypoglycemia despite tube feeds.  Objective   Blood pressure 131/78, pulse 93, temperature 99 F (37.2 C), resp. rate (!) 27, height 5\' 4"  (1.626 m), weight 47.2 kg, SpO2 99 %. CVP:  [4 mmHg-6 mmHg] 6 mmHg  Vent Mode: PRVC FiO2 (%):  [50 %-100 %] 50 % Set Rate:  [16 bmp-30 bmp] 30 bmp Vt Set:  [430 mL] 430 mL PEEP:  [5 cmH20-8 cmH20] 8 cmH20 Plateau Pressure:  [14 cmH20-19 cmH20] 18 cmH20   Intake/Output Summary (Last 24 hours) at 05/25/2019 0934 Last data filed at 05/25/2019 0600 Gross per 24 hour  Intake 3493.56 ml  Output 1360 ml  Net 2133.56 ml   Filed Weights   05/24/19 1420  Weight: 47.2 kg    Examination: General: Chronically ill-appearing 69 year old female who appears much older than stated age, she is frail, malnourished, mottled, with multiple areas of bruising HENT: Normocephalic atraumatic no jugular venous distention pupils pinpoint but reactive orally intubated no JVD Lungs: Equal chest rise bilaterally diminished breath sounds bibasilar no wheezing Cardiovascular: Tachycardic regular rhythm no murmur rub or gallop Abdomen: Soft nontender no organomegaly Extremities:trace lower extremity edema, multiple areas of ecchymosis over upper extremities and over lower back Neuro: On continuous sedation.  Somnolent but will awaken and track to voice.  Diffuse coarse high-frequency tremor in all 4 extremities worse with stimulation. GU: Concentrated yellow urine  Resolved Hospital Problem list    Assessment & Plan:   Critically ill due to acute hypoxic respiratory failure in the setting of presumed aspiration pneumonia versus HCAP, requiring mechanical ventilation Improving oxygenation. -Continue current ventilator settings. -Initiate SBT tomorrow  Critically ill due to septic shock requiring titration of vasopressors  Presumed secondary to aspiration  pneumonia/HCAP -Vasopressin stopped -Wean norepinephrine -Appears euvolemic at this time.  Acute toxic/metabolic encephalopathy.  Underlying dementia as cause of failure to thrive.  Parkinsonian-like tremor. -Neurology consultation to evaluate whether antiparkinsonian treatment may be of benefit.  Undiagnosed Parkinson's disease could explain her progressive decline. Spoke to Neurology and they would prefer to evaluate her once she is extubated  History of seizure disorder. Long-standing epilepsy since childhood.  Seizures apparently well controlled later in life. No evidence of ongoing seizure activity Continue Depakote, Neurontin, and Keppra.   History of hypothyroidism Continue current Synthroid dosing at 25 mcg a day  Chronic thrombocytopenia Platelet count been holding in the 60s  Stage I pressure ulcer with discoloration over coccyx and sacrum Air overlay mattress Maximize nutritional status Frequent repositioning Wound ostomy eval  Daily Goals Checklist  Pain/Anxiety/Delirium protocol (if indicated): Intermittent fentanyl only VAP protocol (if indicated): Bundle in place Respiratory support goals: Continue lung protective ventilation.  SBT tomorrow Blood pressure target: Vasopressin stopped.  Wean norepinephrine.  Keep MAP greater than 65 DVT prophylaxis: SCDs only given thrombocytopenia Nutrition Status: Severely malnourished Nutrition Problem: Inadequate oral intake Etiology: inability to eat Signs/Symptoms: NPO status Interventions: Tube feeding GI prophylaxis: Pantoprazole Fluid status goals: Allow autoregulation Urinary catheter: Change to pure wick Central line: Right IJ triple-lumen.  Can be removed once off vasopressors Glucose control: Episodes of hypoglycemia due to severe malnutrition. Mobility/therapy needs: Bedrest Antibiotic de-escalation: Discontinue antibiotics if cultures negative at 48 hours Home medication reconciliation: Continuing home  anticonvulsants and Synthroid. Daily labs: CBC, BMP Code Status: DNR Family Communication: I spoke to her brother who confirmed a history of tremors for the last year.  Festinate gait difficulty getting up from chairs. Disposition: ICU.   Labs   CBC: Recent Labs  Lab 05/24/19 1513 05/24/19 1531 05/24/19 1722 05/24/19  2057 05/25/19 0026 05/25/19 0343 05/25/19 0353  WBC 4.5  --   --   --   --   --  12.6*  NEUTROABS 2.2  --   --   --   --   --   --   HGB 12.0   < > 9.9* 9.5* 9.2* 9.5* 10.2*  HCT 36.8   < > 29.0* 28.0* 27.0* 28.0* 30.8*  MCV 105.7*  --   --   --   --   --  103.4*  PLT 123*  --   --   --   --   --  68*   < > = values in this interval not displayed.    Basic Metabolic Panel: Recent Labs  Lab 05/24/19 1513 05/24/19 1531 05/24/19 2057 05/25/19 0026 05/25/19 0343 05/25/19 0353 05/25/19 0555  NA 144   < > 141 139 142 142 142  K 3.0*   < > 3.4* 3.1* 3.3* 3.3* 3.4*  CL 114*  --   --   --   --  112* 112*  CO2 23  --   --   --   --  22 22  GLUCOSE 93  --   --   --   --  136* 130*  BUN 13  --   --   --   --  12 12  CREATININE 0.61  --   --   --   --  0.50 0.52  CALCIUM 7.9*  --   --   --   --  8.0* 8.0*  MG 1.5*  --   --   --   --  1.9  --   PHOS 3.7  --   --   --   --  2.0*  --    < > = values in this interval not displayed.   GFR: Estimated Creatinine Clearance: 50.2 mL/min (by C-G formula based on SCr of 0.52 mg/dL). Recent Labs  Lab 05/24/19 1440 05/24/19 1513 05/24/19 1740 05/25/19 0353  PROCALCITON  --  3.30  --   --   WBC  --  4.5  --  12.6*  LATICACIDVEN 2.6*  --  3.8*  --     Liver Function Tests: Recent Labs  Lab 05/24/19 1513  AST 17  ALT 12  ALKPHOS 42  BILITOT 0.6  PROT 3.8*  ALBUMIN 1.8*   No results for input(s): LIPASE, AMYLASE in the last 168 hours. No results for input(s): AMMONIA in the last 168 hours.  ABG    Component Value Date/Time   PHART 7.275 (L) 05/25/2019 0343   PCO2ART 41.8 05/25/2019 0343   PO2ART 96.0  05/25/2019 0343   HCO3 19.4 (L) 05/25/2019 0343   TCO2 21 (L) 05/25/2019 0343   ACIDBASEDEF 7.0 (H) 05/25/2019 0343   O2SAT 97.0 05/25/2019 0343     Coagulation Profile: No results for input(s): INR, PROTIME in the last 168 hours.  Cardiac Enzymes: No results for input(s): CKTOTAL, CKMB, CKMBINDEX, TROPONINI in the last 168 hours.  HbA1C: No results found for: HGBA1C  CBG: Recent Labs  Lab 05/24/19 2303 05/24/19 2337 05/25/19 0237 05/25/19 0305 05/25/19 0708  GLUCAP 53* 119* 66* 170* 86   CRITICAL CARE Performed by: Lynnell Catalan   Total critical care time: 50 minutes  Critical care time was exclusive of separately billable procedures and treating other patients.  Critical care was necessary to treat or prevent imminent or life-threatening deterioration.  Critical care was time spent  personally by me on the following activities: development of treatment plan with patient and/or surrogate as well as nursing, discussions with consultants, evaluation of patient's response to treatment, examination of patient, obtaining history from patient or surrogate, ordering and performing treatments and interventions, ordering and review of laboratory studies, ordering and review of radiographic studies, pulse oximetry, re-evaluation of patient's condition and participation in multidisciplinary rounds.  Lynnell Catalan, MD [facility] ICU Physician [facility] Critical Care  Pager: [phone] Mobile: [phone] After hours: [phone].

## 2019-05-25 NOTE — Procedures (Signed)
Cortrak  Person Inserting Tube:  Dartanyan Deasis, RD Tube Type:  Cortrak - 43 inches Tube Location:  Right nare Initial Placement:  Stomach Secured by: Bridle Technique Used to Measure Tube Placement:  Documented cm marking at nare/ corner of mouth Cortrak Secured At:  80 cm   No x-ray is required. RN may begin using tube.   If the tube becomes dislodged please keep the tube and contact the Cortrak team at [email or web address] ([email or web address]) for replacement.  If after hours and replacement cannot be delayed, place a NG tube and confirm placement with an abdominal x-ray.    Marji Kuehnel RD, LDN Clinical Nutrition Pager listed in AMION     

## 2019-05-25 NOTE — Progress Notes (Signed)
eLink Physician-Brief Progress Note Patient Name: ABBIGAYLE TOOLE DOB: 05/18/1950 MRN: [id number]   Date of Service  05/25/2019  HPI/Events of Note  Two episodes of hypoglycemia overnight. She is on goal TF rate of 40cc/hr and tolerating this well per report.   eICU Interventions  Switch LR fluid infusion to D5 LR to help prevent recurrence of hypoglycemia.     Intervention Category Intermediate Interventions: Other:  Janae Bridgeman 05/25/2019, 3:35 AM

## 2019-05-26 DIAGNOSIS — A419 Sepsis, unspecified organism: Principal | ICD-10-CM

## 2019-05-26 DIAGNOSIS — J690 Pneumonitis due to inhalation of food and vomit: Secondary | ICD-10-CM

## 2019-05-26 DIAGNOSIS — G9340 Encephalopathy, unspecified: Secondary | ICD-10-CM

## 2019-05-26 DIAGNOSIS — R6521 Severe sepsis with septic shock: Secondary | ICD-10-CM

## 2019-05-26 LAB — CBC WITH DIFFERENTIAL/PLATELET
Abs Immature Granulocytes: 0.98 10*3/uL — ABNORMAL HIGH (ref 0.00–0.07)
Basophils Absolute: 0 10*3/uL (ref 0.0–0.1)
Basophils Relative: 0 %
Eosinophils Absolute: 0 10*3/uL (ref 0.0–0.5)
Eosinophils Relative: 0 %
HCT: 27.8 % — ABNORMAL LOW (ref 36.0–46.0)
Hemoglobin: 9.4 g/dL — ABNORMAL LOW (ref 12.0–15.0)
Immature Granulocytes: 5 %
Lymphocytes Relative: 4 %
Lymphs Abs: 0.8 10*3/uL (ref 0.7–4.0)
MCH: 34.8 pg — ABNORMAL HIGH (ref 26.0–34.0)
MCHC: 33.8 g/dL (ref 30.0–36.0)
MCV: 103 fL — ABNORMAL HIGH (ref 80.0–100.0)
Monocytes Absolute: 5.7 10*3/uL — ABNORMAL HIGH (ref 0.1–1.0)
Monocytes Relative: 28 %
Neutro Abs: 13.2 10*3/uL — ABNORMAL HIGH (ref 1.7–7.7)
Neutrophils Relative %: 63 %
Platelets: 57 10*3/uL — ABNORMAL LOW (ref 150–400)
RBC: 2.7 MIL/uL — ABNORMAL LOW (ref 3.87–5.11)
RDW: 15.4 % (ref 11.5–15.5)
WBC: 20.6 10*3/uL — ABNORMAL HIGH (ref 4.0–10.5)
nRBC: 0.1 % (ref 0.0–0.2)

## 2019-05-26 LAB — GLUCOSE, CAPILLARY
Glucose-Capillary: 108 mg/dL — ABNORMAL HIGH (ref 70–99)
Glucose-Capillary: 132 mg/dL — ABNORMAL HIGH (ref 70–99)
Glucose-Capillary: 114 mg/dL — ABNORMAL HIGH (ref 70–99)
Glucose-Capillary: 101 mg/dL — ABNORMAL HIGH (ref 70–99)
Glucose-Capillary: 120 mg/dL — ABNORMAL HIGH (ref 70–99)
Glucose-Capillary: 110 mg/dL — ABNORMAL HIGH (ref 70–99)

## 2019-05-26 LAB — MAGNESIUM: Magnesium: 2 mg/dL (ref 1.7–2.4)

## 2019-05-26 LAB — BASIC METABOLIC PANEL
Anion gap: 6 (ref 5–15)
BUN: 10 mg/dL (ref 8–23)
CO2: 23 mmol/L (ref 22–32)
Calcium: 8 mg/dL — ABNORMAL LOW (ref 8.9–10.3)
Chloride: 114 mmol/L — ABNORMAL HIGH (ref 98–111)
Creatinine, Ser: 0.48 mg/dL (ref 0.44–1.00)
GFR calc Af Amer: >60 mL/min (ref >60)
GFR calc non Af Amer: >60 mL/min (ref >60)
Glucose, Bld: 124 mg/dL — ABNORMAL HIGH (ref 70–99)
Potassium: 3.7 mmol/L (ref 3.5–5.1)
Sodium: 143 mmol/L (ref 135–145)

## 2019-05-26 LAB — LACTIC ACID, PLASMA
Lactic Acid, Venous: 2 mmol/L (ref 0.5–1.9)
Lactic Acid, Venous: 2 mmol/L (ref 0.5–1.9)

## 2019-05-26 LAB — PHOSPHORUS: Phosphorus: 1.9 mg/dL — ABNORMAL LOW (ref 2.5–4.6)

## 2019-05-26 MED ORDER — AMIODARONE LOAD VIA INFUSION: 150.0000 mg | Freq: Once | INTRAVENOUS | Status: DC

## 2019-05-26 MED ORDER — POTASSIUM PHOSPHATES 15 MMOLE/5ML IV SOLN
15.0000 mmol | Freq: Once | INTRAVENOUS | Status: AC
  Administered 2019-05-26: 10:00:00 15 mmol via INTRAVENOUS
  Filled 2019-05-26: qty 5

## 2019-05-26 MED ORDER — AMIODARONE HCL IN DEXTROSE 360-4.14 MG/200ML-% IV SOLN
60.0000 mg/h | INTRAVENOUS | Status: AC
  Administered 2019-05-26 (×2): 60 mg/h via INTRAVENOUS
  Filled 2019-05-26: qty 200

## 2019-05-26 MED ORDER — METOPROLOL TARTRATE 5 MG/5ML IV SOLN
INTRAVENOUS | Status: AC
  Administered 2019-05-26: 16:00:00 5 mg via INTRAVENOUS
  Filled 2019-05-26: qty 5

## 2019-05-26 MED ORDER — AMIODARONE HCL IN DEXTROSE 360-4.14 MG/200ML-% IV SOLN: 30.0000 mg/h | INTRAVENOUS | Status: DC

## 2019-05-26 MED ORDER — AMIODARONE HCL IN DEXTROSE 360-4.14 MG/200ML-% IV SOLN
30.0000 mg/h | INTRAVENOUS | Status: AC
  Administered 2019-05-27: 30 mg/h via INTRAVENOUS
  Filled 2019-05-26 (×2): qty 200

## 2019-05-26 MED ORDER — AMIODARONE LOAD VIA INFUSION
150.0000 mg | Freq: Once | INTRAVENOUS | Status: AC
  Administered 2019-05-26: 17:00:00 150 mg via INTRAVENOUS
  Filled 2019-05-26: qty 83.34

## 2019-05-26 MED ORDER — AMIODARONE HCL IN DEXTROSE 360-4.14 MG/200ML-% IV SOLN: 60.0000 mg/h | INTRAVENOUS | Status: DC

## 2019-05-26 MED ORDER — LACTATED RINGERS IV BOLUS
500.0000 mL | Freq: Once | INTRAVENOUS | Status: AC
  Administered 2019-05-26: 14:00:00 500 mL via INTRAVENOUS

## 2019-05-26 MED ORDER — METOPROLOL TARTRATE 5 MG/5ML IV SOLN: 5.0000 mg | Freq: Once | INTRAVENOUS | Status: AC

## 2019-05-26 NOTE — Progress Notes (Signed)
PCCM Interval Note  Updated brother on patient's current medical condition. He informed me that patient has an adult daughter that is coming to visit her today. He is unsure if she is interested in making medical decisions.  Mechele Collin, M.D. [facility] Pulmonary/Critical Care Medicine 05/26/2019 12:44 PM

## 2019-05-26 NOTE — Progress Notes (Signed)
eLink Physician-Brief Progress Note Patient Name: Barbara Villanueva DOB: [birth year] MRN: [id number]   Date of Service  05/26/2019  HPI/Events of Note  Per bedside RN when pulse ox probe is tracking saturation in the upper 90's, occasionally fails to track with spurious readings, BP currently 135/77  eICU Interventions  No intervention.        Thomasene Lot Genaro Bekker 05/26/2019, 5:02 AM

## 2019-05-26 NOTE — Progress Notes (Signed)
Patient in A-fib with RVR in 170's, according to 12 lead EKG. Notified Dr. Mechele Collin. Order for 5 mg metoprolol given. Will continue to monitor.  Darrold Span, RN

## 2019-05-26 NOTE — Progress Notes (Addendum)
NAME:  Barbara Villanueva, MRN:  [id number], DOB:  26-Aug-1950, LOS: 2 ADMISSION DATE:  05/24/2019, CONSULTATION DATE: 3/18 REFERRING MD: 3/18, CHIEF COMPLAINT: [facility]  Brief History   69 year old female with history of hypothyroidism, hypertension, seizure disorder.  Admitted with generalized failure to thrive over approximately 2 weeks timeframe on 3/14.  Had been making improvements and was to be transferred to skilled nursing facility on 318, however early that morning developed acute respiratory distress with complete opacification of left hemithorax resulting in need for intubation, was hypotensive post intubation.  Transferred to [facility] for further ration and treatment  History of present illness   69 year old chronically ill-appearing female with below listed comorbidities was brought to the [facility] emergency room on 3/14 by her brother with report of 2-week history of worsening anorexia, decreased p.o. intake, fatigue, increased somnolence and worsening confusion.  In the emergency room she was afebrile, sodium 141, normal BUN and creatinine.  A CT of head was obtained this was unremarkable.  She is admitted for evaluation of acute encephalopathy.  And just generalized failure to thrive therapeutic interventions have included primarily IV fluid and supportive care in addition to further neurologic evaluation which had been negative to date.  She was accidentally to be transferred to skilled nursing facility on 3/18 however overnight/in the early a.m. hours of 3/18 developed acute hypoxia, worsening work of breathing, and chest x-ray with complete opacification of the left hemithorax, she was intubated with a working diagnosis of presumed aspiration event with secondary progressive circulatory shock.  She was transferred to [facility] for further evaluation.   The patient's brother revealed thatshe has had over a years decline in health, weight, as well as cognitive  function, but this is more rapidly declined over the last 2 weeks leading to her hospitalization.  There is a family history of dementia.  Past Medical History  Hypothyroidism, hypertension, seizure disorder.  Significant Hospital Events    Admitted to [facility] 3/14, initial working diagnosis of metabolic encephalopathy, failure to thrive, hypothyroidism, weakness, and dehydration as well as protein calorie malnutrition  3/14 through 3/17: Receiving IV fluids, supportive care, extensive diagnostic evaluation without significant positive diagnostic findings specifically negative CT brain, no acute findings on MRI brain, B12, folate, and TSH all within normal limits no evidence of seizure, no evidence of acute infection.  She had made some moderate improvement, and was to be transferred to skilled nursing facility  3/18: Developed acute respiratory failure overnight, acute left-sided opacification of the left hemithorax with associated respiratory failure requiring intubation, complicated by circulatory shock, central venous line placed, initiated on vasoactive drips.  Transferred to [facility].  Aggressive fluid resuscitation 3/18  Consults:  None  Procedures:  Oral endotracheal tube 3/18:([place])>>> Right IJ triple-lumen catheter 3/18 ([place])>>>  Significant Diagnostic Tests:   CT brain 3/14: Negative for CVA  MRI brain at [facility] 3/17: Motion artifact but no evidence of recent infarction, hemorrhage or mass minor chronic microvascular changes  EEG 3/18: study is suggestive of moderate to severe diffuse encephalopathy, non specific to etiology.  No seizures or epileptiform discharges were seen throughout the recording.  Micro Data:   Respiratory culture 3/18  Urine culture 3/18  Blood culture 3/18  COVID-19 3/17: Negative completed at [facility] and confirmed  Antimicrobials:  Ceftriaxone and a azithromycin x1 dose 3/18 Cefepime 3/18>>> Vancomycin 3/18>>  Interim  history/subjective:  Remains on levophed this morning. On minimal vent settings now. Follows commands with nursing however unable  to with me.  Objective   Blood pressure (!) 149/70, pulse 76, temperature 99 F (37.2 C), resp. rate (!) 30, height 5\' 4"  (1.626 m), weight 57.4 kg, SpO2 100 %. CVP:  [2 mmHg-11 mmHg] 4 mmHg  Vent Mode: PRVC FiO2 (%):  [40 %-50 %] 40 % Set Rate:  [20 bmp-30 bmp] 30 bmp Vt Set:  [430 mL] 430 mL PEEP:  [5 cmH20-8 cmH20] 5 cmH20 Plateau Pressure:  [15 cmH20-30 cmH20] 15 cmH20   Intake/Output Summary (Last 24 hours) at 05/26/2019 0725 Last data filed at 05/26/2019 0700 Gross per 24 hour  Intake 3011.94 ml  Output 640 ml  Net 2371.94 ml   Filed Weights   05/24/19 1420 05/26/19 0500  Weight: 47.2 kg 57.4 kg   Physical Exam: General: Chronically ill-appearing, no acute distress HENT: [place], AT, ETT in place Eyes: EOMI, no scleral icterus Respiratory: Diminished breath sounds bilaterally. No crackles, wheezing or rales Cardiovascular: RRR, -M/R/G, no JVD GI: BS+, soft, nontender Extremities:+1 lower extremity edema,-tenderness Neuro: Opens eyes to voice, grimaces to noxious stimuli, moves extremities x 4, does not follow commands. Right tremor GU: Foley in place  CXR 3/20 Left lower lobe infiltrate, improved compared to 3/18  Resolved Hospital Problem list    Assessment & Plan:   Critically ill due to acute hypoxic respiratory failure in the setting of presumed aspiration pneumonia versus HCAP, requiring mechanical ventilation Improving oxygenation. -SBT today. Extubation may be precluded by mental status  Septic shock secondary to aspiration pneumonia/HCAP -Continue to wean norepinephrine for SBP >90 -Scheduled steroids -Follow-up blood and respiratory cultures -Repeat LA  Acute toxic/metabolic encephalopathy.  Underlying dementia as cause of failure to thrive.  Parkinsonian-like tremor. Intermittently follows commands -Neurology consultation to  evaluate whether antiparkinsonian treatment may be of benefit.  Undiagnosed Parkinson's disease could explain her progressive decline. Spoke to Neurology and they would prefer to evaluate her once she is extubated -Hold neurotin  History of seizure disorder. Long-standing epilepsy since childhood.  Seizures apparently well controlled later in life. No evidence of ongoing seizure activity -Continue Depakote and Keppra.  -Hold neurotin  History of hypothyroidism -Continue current Synthroid dosing at 25 mcg a day  Chronic thrombocytopenia -Platelet count been holding in the 60s -Holding on chemical DVT ppx. SCDs only  Stage I pressure ulcer with discoloration over coccyx and sacrum -Air overlay mattress -Maximize nutritional status -Frequent repositioning -Wound ostomy eval  Hypoglycemia Improved -Continue TF -Check CBG q4 -DC D5 gtt  Hypophosphatemia -Replete  Daily Goals Checklist  Pain/Anxiety/Delirium protocol (if indicated): Intermittent fentanyl only VAP protocol (if indicated): Bundle in place Respiratory support goals: SBY Blood pressure target: Vasopressin stopped.  Wean norepinephrine.  Keep MAP greater than 65 DVT prophylaxis: SCDs only given thrombocytopenia Nutrition Status: Severely malnourished Nutrition Problem: Inadequate oral intake Etiology: inability to eat Signs/Symptoms: NPO status Interventions: Tube feeding GI prophylaxis: Pantoprazole Fluid status goals: Allow autoregulation Urinary catheter: Change to pure wick Central line: Right IJ triple-lumen.  Can be removed once off vasopressors Glucose control: Episodes of hypoglycemia due to severe malnutrition. Mobility/therapy needs: Bedrest Antibiotic de-escalation: Discontinue antibiotics if cultures negative at 48 hours Home medication reconciliation: Continuing home anticonvulsants and Synthroid. Daily labs: CBC, BMP Code Status: DNR Family Communication: I spoke to her brother who confirmed a  history of tremors for the last year.  Festinate gait difficulty getting up from chairs. Disposition: ICU.   Labs   CBC: Recent Labs  Lab 05/24/19 1513 05/24/19 1531 05/24/19 2057 05/25/19 0026 05/25/19 05/27/19  05/25/19 0353 05/26/19 0416  WBC 4.5  --   --   --   --  12.6* 20.6*  NEUTROABS 2.2  --   --   --   --   --  13.2*  HGB 12.0   < > 9.5* 9.2* 9.5* 10.2* 9.4*  HCT 36.8   < > 28.0* 27.0* 28.0* 30.8* 27.8*  MCV 105.7*  --   --   --   --  103.4* 103.0*  PLT 123*  --   --   --   --  68* 57*   < > = values in this interval not displayed.    Basic Metabolic Panel: Recent Labs  Lab 05/24/19 1513 05/24/19 1531 05/25/19 0026 05/25/19 0343 05/25/19 0353 05/25/19 0555 05/25/19 1635 05/26/19 0416  NA 144   < > 139 142 142 142  --  143  K 3.0*   < > 3.1* 3.3* 3.3* 3.4*  --  3.7  CL 114*  --   --   --  112* 112*  --  114*  CO2 23  --   --   --  22 22  --  23  GLUCOSE 93  --   --   --  136* 130*  --  124*  BUN 13  --   --   --  12 12  --  10  CREATININE 0.61  --   --   --  0.50 0.52  --  0.48  CALCIUM 7.9*  --   --   --  8.0* 8.0*  --  8.0*  MG 1.5*  --   --   --  1.9  --  2.0 2.0  PHOS 3.7  --   --   --  2.0*  --  1.9* 1.9*   < > = values in this interval not displayed.   GFR: Estimated Creatinine Clearance: 58.1 mL/min (by C-G formula based on SCr of 0.48 mg/dL). Recent Labs  Lab 05/24/19 1440 05/24/19 1513 05/24/19 1740 05/25/19 0353 05/26/19 0416  PROCALCITON  --  3.30  --   --   --   WBC  --  4.5  --  12.6* 20.6*  LATICACIDVEN 2.6*  --  3.8*  --   --     Liver Function Tests: Recent Labs  Lab 05/24/19 1513  AST 17  ALT 12  ALKPHOS 42  BILITOT 0.6  PROT 3.8*  ALBUMIN 1.8*   No results for input(s): LIPASE, AMYLASE in the last 168 hours. No results for input(s): AMMONIA in the last 168 hours.  ABG    Component Value Date/Time   PHART 7.275 (L) 05/25/2019 0343   PCO2ART 41.8 05/25/2019 0343   PO2ART 96.0 05/25/2019 0343   HCO3 19.4 (L) 05/25/2019  0343   TCO2 21 (L) 05/25/2019 0343   ACIDBASEDEF 7.0 (H) 05/25/2019 0343   O2SAT 97.0 05/25/2019 0343     Coagulation Profile: No results for input(s): INR, PROTIME in the last 168 hours.  Cardiac Enzymes: No results for input(s): CKTOTAL, CKMB, CKMBINDEX, TROPONINI in the last 168 hours.  HbA1C: No results found for: HGBA1C  CBG: Recent Labs  Lab 05/25/19 1516 05/25/19 2016 05/25/19 2319 05/26/19 0418 05/26/19 0700  GLUCAP 125* 102* 116* 108* 132*   The patient is critically ill with multiple organ systems failure and requires high complexity decision making for assessment and support, frequent evaluation and titration of therapies, application of advanced monitoring technologies and extensive interpretation of multiple databases.  Critical Care Time devoted to patient care services described in this note is 35 Minutes.   Mechele Collin, M.D. [facility] Pulmonary/Critical Care Medicine 05/26/2019 7:26 AM   Please see Amion for pager number to reach on-call Pulmonary and Critical Care Team.

## 2019-05-27 ENCOUNTER — Inpatient Hospital Stay (HOSPITAL_COMMUNITY): Payer: Medicare PPO

## 2019-05-27 DIAGNOSIS — Z9911 Dependence on respirator [ventilator] status: Secondary | ICD-10-CM

## 2019-05-27 LAB — GLUCOSE, CAPILLARY
Glucose-Capillary: 105 mg/dL — ABNORMAL HIGH (ref 70–99)
Glucose-Capillary: 112 mg/dL — ABNORMAL HIGH (ref 70–99)
Glucose-Capillary: 92 mg/dL (ref 70–99)
Glucose-Capillary: 101 mg/dL — ABNORMAL HIGH (ref 70–99)
Glucose-Capillary: 101 mg/dL — ABNORMAL HIGH (ref 70–99)
Glucose-Capillary: 75 mg/dL (ref 70–99)

## 2019-05-27 LAB — CBC WITH DIFFERENTIAL/PLATELET
Abs Immature Granulocytes: 0.07 10*3/uL (ref 0.00–0.07)
Basophils Absolute: 0.1 10*3/uL (ref 0.0–0.1)
Basophils Relative: 1 %
Eosinophils Absolute: 0 10*3/uL (ref 0.0–0.5)
Eosinophils Relative: 0 %
HCT: 26.5 % — ABNORMAL LOW (ref 36.0–46.0)
Hemoglobin: 8.7 g/dL — ABNORMAL LOW (ref 12.0–15.0)
Immature Granulocytes: 1 %
Lymphocytes Relative: 4 %
Lymphs Abs: 0.6 10*3/uL — ABNORMAL LOW (ref 0.7–4.0)
MCH: 33.9 pg (ref 26.0–34.0)
MCHC: 32.8 g/dL (ref 30.0–36.0)
MCV: 103.1 fL — ABNORMAL HIGH (ref 80.0–100.0)
Monocytes Absolute: 2.7 10*3/uL — ABNORMAL HIGH (ref 0.1–1.0)
Monocytes Relative: 19 %
Neutro Abs: 11 10*3/uL — ABNORMAL HIGH (ref 1.7–7.7)
Neutrophils Relative %: 75 %
Platelets: 37 10*3/uL — ABNORMAL LOW (ref 150–400)
RBC: 2.57 MIL/uL — ABNORMAL LOW (ref 3.87–5.11)
RDW: 15.4 % (ref 11.5–15.5)
WBC Morphology: INCREASED
WBC: 14.5 10*3/uL — ABNORMAL HIGH (ref 4.0–10.5)
nRBC: 0.5 % — ABNORMAL HIGH (ref 0.0–0.2)

## 2019-05-27 LAB — BASIC METABOLIC PANEL
Anion gap: 7 (ref 5–15)
BUN: 16 mg/dL (ref 8–23)
CO2: 26 mmol/L (ref 22–32)
Calcium: 8.3 mg/dL — ABNORMAL LOW (ref 8.9–10.3)
Chloride: 107 mmol/L (ref 98–111)
Creatinine, Ser: 0.47 mg/dL (ref 0.44–1.00)
GFR calc Af Amer: >60 mL/min (ref >60)
GFR calc non Af Amer: >60 mL/min (ref >60)
Glucose, Bld: 102 mg/dL — ABNORMAL HIGH (ref 70–99)
Potassium: 4.5 mmol/L (ref 3.5–5.1)
Sodium: 140 mmol/L (ref 135–145)

## 2019-05-27 LAB — CULTURE, RESPIRATORY W GRAM STAIN: Culture: NORMAL

## 2019-05-27 LAB — SAVE SMEAR(SSMR), FOR PROVIDER SLIDE REVIEW

## 2019-05-27 MED ORDER — ALBUTEROL SULFATE (2.5 MG/3ML) 0.083% IN NEBU: 2.5000 mg | INHALATION_SOLUTION | RESPIRATORY_TRACT | Status: DC | PRN

## 2019-05-27 MED ORDER — DEXTROSE 10 % IV SOLN: INTRAVENOUS | Status: DC

## 2019-05-27 NOTE — Progress Notes (Signed)
RT NTS pt and removed copious amounts of white/tan secretions.  Pt tolerated fairly well.  Pt has weak cough and is weak in general and unable to use flutter device at this time.  RT will continue to monitor.

## 2019-05-27 NOTE — Progress Notes (Addendum)
NAME:  Barbara Villanueva, MRN:  [id number], DOB:  07/12/1950, LOS: 3 ADMISSION DATE:  05/24/2019, CONSULTATION DATE: 3/18 REFERRING MD: 3/18, CHIEF COMPLAINT: [facility]  Brief History   69 year old female with history of hypothyroidism, hypertension, seizure disorder.  Admitted with generalized failure to thrive over approximately 2 weeks timeframe on 3/14.  Had been making improvements and was to be transferred to skilled nursing facility on 318, however early that morning developed acute respiratory distress with complete opacification of left hemithorax resulting in need for intubation, was hypotensive post intubation.  Transferred to [facility] for further ration and treatment  History of present illness   69 year old chronically ill-appearing female with below listed comorbidities was brought to the [facility] emergency room on 3/14 by her brother with report of 2-week history of worsening anorexia, decreased p.o. intake, fatigue, increased somnolence and worsening confusion.  In the emergency room she was afebrile, sodium 141, normal BUN and creatinine.  A CT of head was obtained this was unremarkable.  She is admitted for evaluation of acute encephalopathy.  And just generalized failure to thrive therapeutic interventions have included primarily IV fluid and supportive care in addition to further neurologic evaluation which had been negative to date.  She was accidentally to be transferred to skilled nursing facility on 3/18 however overnight/in the early a.m. hours of 3/18 developed acute hypoxia, worsening work of breathing, and chest x-ray with complete opacification of the left hemithorax, she was intubated with a working diagnosis of presumed aspiration event with secondary progressive circulatory shock.  She was transferred to [facility] for further evaluation.   The patient's brother revealed thatshe has had over a years decline in health, weight, as well as cognitive  function, but this is more rapidly declined over the last 2 weeks leading to her hospitalization.  There is a family history of dementia.  Past Medical History  Hypothyroidism, hypertension, seizure disorder.  Significant Hospital Events    Admitted to [facility] 3/14, initial working diagnosis of metabolic encephalopathy, failure to thrive, hypothyroidism, weakness, and dehydration as well as protein calorie malnutrition  3/14 through 3/17: Receiving IV fluids, supportive care, extensive diagnostic evaluation without significant positive diagnostic findings specifically negative CT brain, no acute findings on MRI brain, B12, folate, and TSH all within normal limits no evidence of seizure, no evidence of acute infection.  She had made some moderate improvement, and was to be transferred to skilled nursing facility  3/18: Developed acute respiratory failure overnight, acute left-sided opacification of the left hemithorax with associated respiratory failure requiring intubation, complicated by circulatory shock, central venous line placed, initiated on vasoactive drips.  Transferred to [facility].  Aggressive fluid resuscitation 3/18  Consults:  None  Procedures:  Oral endotracheal tube 3/18:([place])>>> Right IJ triple-lumen catheter 3/18 ([place])>>>  Significant Diagnostic Tests:   CT brain 3/14: Negative for CVA  MRI brain at [facility] 3/17: Motion artifact but no evidence of recent infarction, hemorrhage or mass minor chronic microvascular changes  EEG 3/18: study is suggestive of moderate to severe diffuse encephalopathy, non specific to etiology.  No seizures or epileptiform discharges were seen throughout the recording.  Micro Data:   Respiratory culture 3/18  Urine culture 3/18  Blood culture 3/18  COVID-19 3/17: Negative completed at [facility] and confirmed  Antimicrobials:  Ceftriaxone and a azithromycin x1 dose 3/18 Cefepime 3/18>>> Vancomycin 3/18>>  Interim  history/subjective:  Yesterday had episode of AFRVR. Given IV metoprolol 5 mg once with improvement in HR. Due  to her hypotension, she was started on amiodarone gtt. Overnight, weaned off levophed. Awake and alert this morning, intermittently following commands.  Objective   Blood pressure 101/63, pulse 89, temperature 99.5 F (37.5 C), resp. rate 13, height 5\' 4"  (1.626 m), weight 57.4 kg, SpO2 100 %. CVP:  [5 mmHg] 5 mmHg  Vent Mode: PSV;CPAP FiO2 (%):  [40 %] 40 % Set Rate:  [30 bmp] 30 bmp Vt Set:  [430 mL] 430 mL PEEP:  [5 cmH20] 5 cmH20 Pressure Support:  [5 cmH20-10 cmH20] 5 cmH20 Plateau Pressure:  [15 cmH20-19 cmH20] 15 cmH20   Intake/Output Summary (Last 24 hours) at 05/27/2019 0752 Last data filed at 05/27/2019 0700 Gross per 24 hour  Intake 2802.46 ml  Output 615 ml  Net 2187.46 ml   Filed Weights   05/24/19 1420 05/26/19 0500  Weight: 47.2 kg 57.4 kg   Physical Exam: General: Chronically ill-appearing, no acute distress HENT: [place], AT, ETT in place Eyes: EOMI, no scleral icterus Respiratory: Clear to auscultation bilaterally.  No crackles, wheezing or rales Cardiovascular: NSR, rate-controlled, -M/R/G, no JVD GI: BS+, soft, nontender Extremities:-Edema,-tenderness Neuro: Awake, opens eyes to voice, intermittently follows commands, withdraws/spontanously moves extremities x 4   Resolved Hospital Problem list    Assessment & Plan:   Critically ill due to acute hypoxic respiratory failure in the setting of presumed aspiration pneumonia versus HCAP, requiring mechanical ventilation On minimal vent settings -SBT today. Potentially extubate due to improving mental status -VAP  Septic shock secondary to aspiration pneumonia/HCAP  Shock resolved. Off pressors -Complete Cefepime x 5 days -Wean off steroids today -Follow-up blood and respiratory cultures  Atrial fibrillation with RVR Suspect reactive in critical illness -Continue amio gtt -Hold off on  anticoagulation unless recurrent  Acute toxic/metabolic encephalopathy.  Underlying dementia as cause of failure to thrive.  Parkinsonian-like tremor. Intermittently follows commands -Neurology consultation to evaluate whether antiparkinsonian treatment may be of benefit.  Undiagnosed Parkinson's disease could explain her progressive decline. Spoke to Neurology and they would prefer to evaluate her once she is extubated -Hold neurotin  History of seizure disorder. Long-standing epilepsy since childhood.  Seizures apparently well controlled later in life. No evidence of ongoing seizure activity -Continue Depakote and Keppra -Hold neurotin  History of hypothyroidism -Continue current Synthroid 25 mcg  Chronic thrombocytopenia Decreased to 30s -Holding on chemical DVT ppx. SCDs only -Transfuse for Plt <10K or bleeding -Consider Hem/Onc -Continue to trend CBC  Stage I pressure ulcer with discoloration over coccyx and sacrum -Air overlay mattress -Maximize nutritional status -Frequent repositioning -Wound ostomy eval  Hypoglycemia Improved -Continue TF -Check CBG q4  Hypophosphatemia -Replete  Daily Goals Checklist  Pain/Anxiety/Delirium protocol (if indicated): Intermittent fentanyl only VAP protocol (if indicated): Bundle in place Respiratory support goals: SBT DVT prophylaxis: SCDs only given thrombocytopenia Nutrition Status: Severely malnourished Nutrition Problem: Inadequate oral intake Etiology: inability to eat Signs/Symptoms: NPO status Interventions: Tube feeding GI prophylaxis: Pantoprazole Fluid status goals: Allow autoregulation Urinary catheter: Change to pure wick Central line: Right IJ triple-lumen.  Can be removed once off vasopressors Glucose control: Episodes of hypoglycemia due to severe malnutrition. Mobility/therapy needs: Bedrest Home medication reconciliation: Continuing home anticonvulsants and Synthroid. Daily labs: CBC, BMP Code Status:  DNR Family Communication: Updated brother on 3/20. Disposition: ICU.   Labs   CBC: Recent Labs  Lab 05/24/19 1513 05/24/19 1531 05/25/19 0026 05/25/19 0343 05/25/19 0353 05/26/19 0416 05/27/19 0356  WBC 4.5  --   --   --  12.6* 20.6*  14.5*  NEUTROABS 2.2  --   --   --   --  13.2* 11.0*  HGB 12.0   < > 9.2* 9.5* 10.2* 9.4* 8.7*  HCT 36.8   < > 27.0* 28.0* 30.8* 27.8* 26.5*  MCV 105.7*  --   --   --  103.4* 103.0* 103.1*  PLT 123*  --   --   --  68* 57* 37*   < > = values in this interval not displayed.    Basic Metabolic Panel: Recent Labs  Lab 05/24/19 1513 05/24/19 1531 05/25/19 0343 05/25/19 0353 05/25/19 0555 05/25/19 1635 05/26/19 0416 05/27/19 0356  NA 144   < > 142 142 142  --  143 140  K 3.0*   < > 3.3* 3.3* 3.4*  --  3.7 4.5  CL 114*  --   --  112* 112*  --  114* 107  CO2 23  --   --  22 22  --  23 26  GLUCOSE 93  --   --  136* 130*  --  124* 102*  BUN 13  --   --  12 12  --  10 16  CREATININE 0.61  --   --  0.50 0.52  --  0.48 0.47  CALCIUM 7.9*  --   --  8.0* 8.0*  --  8.0* 8.3*  MG 1.5*  --   --  1.9  --  2.0 2.0  --   PHOS 3.7  --   --  2.0*  --  1.9* 1.9*  --    < > = values in this interval not displayed.   GFR: Estimated Creatinine Clearance: 58.1 mL/min (by C-G formula based on SCr of 0.47 mg/dL). Recent Labs  Lab 05/24/19 1440 05/24/19 1513 05/24/19 1740 05/25/19 0353 05/26/19 0416 05/26/19 0745 05/26/19 1147 05/27/19 0356  PROCALCITON  --  3.30  --   --   --   --   --   --   WBC  --  4.5  --  12.6* 20.6*  --   --  14.5*  LATICACIDVEN 2.6*  --  3.8*  --   --  2.0* 2.0*  --     Liver Function Tests: Recent Labs  Lab 05/24/19 1513  AST 17  ALT 12  ALKPHOS 42  BILITOT 0.6  PROT 3.8*  ALBUMIN 1.8*   No results for input(s): LIPASE, AMYLASE in the last 168 hours. No results for input(s): AMMONIA in the last 168 hours.  ABG    Component Value Date/Time   PHART 7.275 (L) 05/25/2019 0343   PCO2ART 41.8 05/25/2019 0343    PO2ART 96.0 05/25/2019 0343   HCO3 19.4 (L) 05/25/2019 0343   TCO2 21 (L) 05/25/2019 0343   ACIDBASEDEF 7.0 (H) 05/25/2019 0343   O2SAT 97.0 05/25/2019 0343     Coagulation Profile: No results for input(s): INR, PROTIME in the last 168 hours.  Cardiac Enzymes: No results for input(s): CKTOTAL, CKMB, CKMBINDEX, TROPONINI in the last 168 hours.  HbA1C: No results found for: HGBA1C  CBG: Recent Labs  Lab 05/26/19 1505 05/26/19 1922 05/26/19 2328 05/27/19 0334 05/27/19 0702  GLUCAP 101* 120* 110* 105* 112*   The patient is critically ill with multiple organ systems failure and requires high complexity decision making for assessment and support, frequent evaluation and titration of therapies, application of advanced monitoring technologies and extensive interpretation of multiple databases.   Critical Care Time devoted to patient care services described  in this note is 35 Minutes.   Mechele Collin, M.D. [facility] Pulmonary/Critical Care Medicine 05/27/2019 7:52 AM   Please see Amion for pager number to reach on-call Pulmonary and Critical Care Team.

## 2019-05-27 NOTE — Procedures (Signed)
Extubation Procedure Note  Patient Details:   Name: Barbara Villanueva DOB: 09-15-1950 MRN: [id number]   Airway Documentation:    Vent end date: 05/27/19 Vent end time: 1610   Evaluation  O2 sats: transiently fell during during procedure Complications: Complications of decreased spo2 requiring NRB, now on HFNC per MD Patient did tolerate procedure well. Bilateral Breath Sounds: Rhonchi, Diminished   Pt extubated per MD order. Placed on 4L [place]. Spo2 decreased low 80's. Pt having hard time clearing secretions. Pt placed on NRB. MD called to bedside. Pt placed on HFNC per MD.  25L/60% tolerating well at this time. RT will continue to monitor   Cherylin Mylar 05/27/2019, 4:40 PM

## 2019-05-27 NOTE — Progress Notes (Signed)
eLink Physician-Brief Progress Note Patient Name: Barbara Villanueva DOB: December 13, 1950 MRN: [id number]   Date of Service  05/27/2019  HPI/Events of Note  CBG 75. Patient has had tube feeds suspending due to high risk of respiratory failure and intubation.   eICU Interventions  Start D10 drip at 40cc/hr to prevent hypoglycemia while tube feeds are on hold.     Intervention Category Minor Interventions: Electrolytes abnormality - evaluation and management  Janae Bridgeman 05/27/2019, 11:46 PM

## 2019-05-28 ENCOUNTER — Inpatient Hospital Stay (HOSPITAL_COMMUNITY): Payer: Medicare PPO

## 2019-05-28 DIAGNOSIS — G40009 Localization-related (focal) (partial) idiopathic epilepsy and epileptic syndromes with seizures of localized onset, not intractable, without status epilepticus: Secondary | ICD-10-CM

## 2019-05-28 DIAGNOSIS — R251 Tremor, unspecified: Secondary | ICD-10-CM

## 2019-05-28 LAB — AMMONIA: Ammonia: 22 umol/L (ref 9–35)

## 2019-05-28 LAB — BASIC METABOLIC PANEL
Anion gap: 8 (ref 5–15)
BUN: 20 mg/dL (ref 8–23)
CO2: 27 mmol/L (ref 22–32)
Calcium: 8.7 mg/dL — ABNORMAL LOW (ref 8.9–10.3)
Chloride: 104 mmol/L (ref 98–111)
Creatinine, Ser: 0.46 mg/dL (ref 0.44–1.00)
GFR calc Af Amer: >60 mL/min (ref >60)
GFR calc non Af Amer: >60 mL/min (ref >60)
Glucose, Bld: 88 mg/dL (ref 70–99)
Potassium: 4.4 mmol/L (ref 3.5–5.1)
Sodium: 139 mmol/L (ref 135–145)

## 2019-05-28 LAB — CBC WITH DIFFERENTIAL/PLATELET
Abs Immature Granulocytes: 0.48 10*3/uL — ABNORMAL HIGH (ref 0.00–0.07)
Basophils Absolute: 0 10*3/uL (ref 0.0–0.1)
Basophils Relative: 0 %
Eosinophils Absolute: 0 10*3/uL (ref 0.0–0.5)
Eosinophils Relative: 0 %
HCT: 33.3 % — ABNORMAL LOW (ref 36.0–46.0)
Hemoglobin: 10.9 g/dL — ABNORMAL LOW (ref 12.0–15.0)
Immature Granulocytes: 3 %
Lymphocytes Relative: 4 %
Lymphs Abs: 0.7 10*3/uL (ref 0.7–4.0)
MCH: 34.1 pg — ABNORMAL HIGH (ref 26.0–34.0)
MCHC: 32.7 g/dL (ref 30.0–36.0)
MCV: 104.1 fL — ABNORMAL HIGH (ref 80.0–100.0)
Monocytes Absolute: 2.5 10*3/uL — ABNORMAL HIGH (ref 0.1–1.0)
Monocytes Relative: 16 %
Neutro Abs: 12.5 10*3/uL — ABNORMAL HIGH (ref 1.7–7.7)
Neutrophils Relative %: 77 %
Platelets: 33 10*3/uL — ABNORMAL LOW (ref 150–400)
RBC: 3.2 MIL/uL — ABNORMAL LOW (ref 3.87–5.11)
RDW: 15.2 % (ref 11.5–15.5)
WBC: 16.2 10*3/uL — ABNORMAL HIGH (ref 4.0–10.5)
nRBC: 0.7 % — ABNORMAL HIGH (ref 0.0–0.2)

## 2019-05-28 LAB — GLUCOSE, CAPILLARY
Glucose-Capillary: 81 mg/dL (ref 70–99)
Glucose-Capillary: 77 mg/dL (ref 70–99)
Glucose-Capillary: 77 mg/dL (ref 70–99)
Glucose-Capillary: 77 mg/dL (ref 70–99)
Glucose-Capillary: 88 mg/dL (ref 70–99)
Glucose-Capillary: 87 mg/dL (ref 70–99)

## 2019-05-28 LAB — VALPROIC ACID LEVEL: Valproic Acid Lvl: 70 ug/mL (ref 50.0–100.0)

## 2019-05-28 MED ORDER — ORAL CARE MOUTH RINSE
15.0000 mL | Freq: Two times a day (BID) | OROMUCOSAL | Status: DC
  Administered 2019-05-28 – 2019-05-30 (×5): 15 mL via OROMUCOSAL

## 2019-05-28 MED ORDER — CHLORHEXIDINE GLUCONATE 0.12 % MT SOLN
15.0000 mL | Freq: Two times a day (BID) | OROMUCOSAL | Status: DC
  Administered 2019-05-28 – 2019-05-29 (×4): 15 mL via OROMUCOSAL
  Filled 2019-05-28 (×3): qty 15

## 2019-05-28 NOTE — Progress Notes (Signed)
EEG Completed; Results Pending  

## 2019-05-28 NOTE — Progress Notes (Signed)
NAME:  Barbara Villanueva, MRN:  [id number], DOB:  November 19, 1950, LOS: 4 ADMISSION DATE:  05/24/2019, CONSULTATION DATE: 3/18 REFERRING MD: 3/18, CHIEF COMPLAINT: [facility]  Brief History   69 year old female with history of hypothyroidism, hypertension, seizure disorder.  Admitted with generalized failure to thrive over approximately 2 weeks timeframe on 3/14.  Had been making improvements and was to be transferred to skilled nursing facility on 318, however early that morning developed acute respiratory distress with complete opacification of left hemithorax resulting in need for intubation, was hypotensive post intubation.  Transferred to [facility] for further ration and treatment  History of present illness   69 year old chronically ill-appearing female with below listed comorbidities was brought to the [facility] emergency room on 3/14 by her brother with report of 2-week history of worsening anorexia, decreased p.o. intake, fatigue, increased somnolence and worsening confusion.  In the emergency room she was afebrile, sodium 141, normal BUN and creatinine.  A CT of head was obtained this was unremarkable.  She is admitted for evaluation of acute encephalopathy.  And just generalized failure to thrive therapeutic interventions have included primarily IV fluid and supportive care in addition to further neurologic evaluation which had been negative to date.  She was accidentally to be transferred to skilled nursing facility on 3/18 however overnight/in the early a.m. hours of 3/18 developed acute hypoxia, worsening work of breathing, and chest x-ray with complete opacification of the left hemithorax, she was intubated with a working diagnosis of presumed aspiration event with secondary progressive circulatory shock.  She was transferred to [facility] for further evaluation.   The patient's brother revealed thatshe has had over a years decline in health, weight, as well as cognitive  function, but this is more rapidly declined over the last 2 weeks leading to her hospitalization.  There is a family history of dementia.  Past Medical History  Hypothyroidism, hypertension, seizure disorder.  Significant Hospital Events    Admitted to [facility] 3/14, initial working diagnosis of metabolic encephalopathy, failure to thrive, hypothyroidism, weakness, and dehydration as well as protein calorie malnutrition  3/14 through 3/17: Receiving IV fluids, supportive care, extensive diagnostic evaluation without significant positive diagnostic findings specifically negative CT brain, no acute findings on MRI brain, B12, folate, and TSH all within normal limits no evidence of seizure, no evidence of acute infection.  She had made some moderate improvement, and was to be transferred to skilled nursing facility  3/18: Developed acute respiratory failure overnight, acute left-sided opacification of the left hemithorax with associated respiratory failure requiring intubation, complicated by circulatory shock, central venous line placed, initiated on vasoactive drips.  Transferred to [facility].  3/21 Extubated to HFNC  3/22 Remains on HFNC  Consults:  None  Procedures:  Oral endotracheal tube 3/18:([place])>>>3/21 Right IJ triple-lumen catheter 3/18 ([place])>>>  Significant Diagnostic Tests:   CT brain 3/14: Negative for CVA  MRI brain at [facility] 3/17: Motion artifact but no evidence of recent infarction, hemorrhage or mass minor chronic microvascular changes  EEG 3/18: study is suggestive of moderate to severe diffuse encephalopathy, non specific to etiology.  No seizures or epileptiform discharges were seen throughout the recording.  Micro Data:   Respiratory culture 3/18 - Normal flora  Blood culture 3/18  COVID-19 3/17: Negative completed at [facility] and confirmed  Antimicrobials:  Ceftriaxone and a azithromycin x1 dose 3/18 Cefepime 3/18>3/22 Vancomycin  3/18  Interim history/subjective:  Extubated yesterday. Remained on HFNC with FIO2 100% on 40L. Awake and  follows commands. Weak cough  Objective   Blood pressure (!) 156/73, pulse (!) 105, temperature 99.5 F (37.5 C), resp. rate 14, height 5\' 4"  (1.626 m), weight 57.4 kg, SpO2 92 %.    Vent Mode: PSV;CPAP FiO2 (%):  [40 %-100 %] 100 % Set Rate:  [30 bmp] 30 bmp Vt Set:  [430 mL] 430 mL PEEP:  [5 cmH20] 5 cmH20 Pressure Support:  [5 cmH20] 5 cmH20 Plateau Pressure:  [14 cmH20] 14 cmH20   Intake/Output Summary (Last 24 hours) at 05/28/2019 0729 Last data filed at 05/28/2019 0500 Gross per 24 hour  Intake 843.33 ml  Output 525 ml  Net 318.33 ml   Filed Weights   05/24/19 1420 05/26/19 0500  Weight: 47.2 kg 57.4 kg   Physical Exam: General: Chronically ill-appearing, frail, no acute distress HENT: [place], AT, OP clear, MMM, wearing HFNC, severe presbycusis Eyes: EOMI, no scleral icterus Respiratory: Coarse breath sounds bilaterally. Cardiovascular: RRR, -M/R/G, no JVD GI: BS+, soft, nontender Extremities:-Edema,-tenderness Neuro: Awake, follows commands, weak cough, moves extremities x 4, RUE tremor (chronic per family)  Resolved Hospital Problem list    Assessment & Plan:   Critically ill due to acute hypoxic respiratory failure in the setting of presumed aspiration pneumonia versus HCAP, requiring mechanical ventilation Extubated yesterday. Respiratory status tenuous and requires monitoring. High risk for re-intubation. Discussed with family GOC due to her dementia. Family wishes to pursue intubation if needed for now but will discuss with family long-term goals. Family would definitely NOT pursue tracheostomy. -Continue HFNC for SpO2 goal >88% -Pulmonary toilet: Duonebs, chest PT, flutter valve  Septic shock secondary to aspiration pneumonia/HCAP - improved Shock resolved. Off pressors -Complete Cefepime x 5 days -Follow-up blood cultures  Atrial fibrillation with RVR  secondary to setting of critical illness. No recurrent episodes -Off amio since 3/21 -Hold off on anticoagulation unless recurrent  Acute toxic/metabolic encephalopathy.  Underlying dementia as cause of failure to thrive.  Parkinsonian-like tremor. Somewhat improving -Neuro consulted to consider undiagnosed Parkinson's disease could explain her progressive decline.  -Hold neurotin  History of seizure disorder. Long-standing epilepsy since childhood.  Seizures apparently well controlled later in life. No evidence of ongoing seizure activity -Continue Depakote and Keppra -Hold neurotin -Repeat EEG for tremors  History of hypothyroidism -Continue current Synthroid 25 mcg  Chronic thrombocytopenia Decreased to 30s -Holding on chemical DVT ppx. SCDs only -Transfuse for Plt <10K or bleeding -Consider Hem/Onc -Continue to trend CBC  Stage I pressure ulcer with discoloration over coccyx and sacrum -Air overlay mattress -Maximize nutritional status -Frequent repositioning -Wound ostomy eval  Hypoglycemia Improved -Restarted TF -Check CBG q4  Hypophosphatemia -Replete -Daily lab  Daily Goals Checklist  Nutrition: TF Pain/Anxiety/Delirium protocol (if indicated): -- VAP protocol (if indicated): -- DVT prophylaxis: SCDs only given thrombocytopenia GI prophylaxis: Pantoprazole Code Status: DNR Family Communication: Updated brother on 3/22. Brother and sister involved in care. Disposition: ICU.   Labs   CBC: Recent Labs  Lab 05/24/19 1513 05/24/19 1531 05/25/19 0343 05/25/19 0353 05/26/19 0416 05/27/19 0356 05/28/19 0500  WBC 4.5  --   --  12.6* 20.6* 14.5* 16.2*  NEUTROABS 2.2  --   --   --  13.2* 11.0* 12.5*  HGB 12.0   < > 9.5* 10.2* 9.4* 8.7* 10.9*  HCT 36.8   < > 28.0* 30.8* 27.8* 26.5* 33.3*  MCV 105.7*  --   --  103.4* 103.0* 103.1* 104.1*  PLT 123*  --   --  68* 57* 37* 33*   < > =  values in this interval not displayed.    Basic Metabolic Panel: Recent  Labs  Lab 05/24/19 1513 05/24/19 1531 05/25/19 0353 05/25/19 0555 05/25/19 1635 05/26/19 0416 05/27/19 0356 05/28/19 0500  NA 144   < > 142 142  --  143 140 139  K 3.0*   < > 3.3* 3.4*  --  3.7 4.5 4.4  CL 114*   < > 112* 112*  --  114* 107 104  CO2 23   < > 22 22  --  23 26 27   GLUCOSE 93   < > 136* 130*  --  124* 102* 88  BUN 13   < > 12 12  --  10 16 20   CREATININE 0.61   < > 0.50 0.52  --  0.48 0.47 0.46  CALCIUM 7.9*   < > 8.0* 8.0*  --  8.0* 8.3* 8.7*  MG 1.5*  --  1.9  --  2.0 2.0  --   --   PHOS 3.7  --  2.0*  --  1.9* 1.9*  --   --    < > = values in this interval not displayed.   GFR: Estimated Creatinine Clearance: 58.1 mL/min (by C-G formula based on SCr of 0.46 mg/dL). Recent Labs  Lab 05/24/19 1440 05/24/19 1513 05/24/19 1513 05/24/19 1740 05/25/19 0353 05/26/19 0416 05/26/19 0745 05/26/19 1147 05/27/19 0356 05/28/19 0500  PROCALCITON  --  3.30  --   --   --   --   --   --   --   --   WBC  --  4.5   < >  --  12.6* 20.6*  --   --  14.5* 16.2*  LATICACIDVEN 2.6*  --   --  3.8*  --   --  2.0* 2.0*  --   --    < > = values in this interval not displayed.    Liver Function Tests: Recent Labs  Lab 05/24/19 1513  AST 17  ALT 12  ALKPHOS 42  BILITOT 0.6  PROT 3.8*  ALBUMIN 1.8*   No results for input(s): LIPASE, AMYLASE in the last 168 hours. No results for input(s): AMMONIA in the last 168 hours.  ABG    Component Value Date/Time   PHART 7.275 (L) 05/25/2019 0343   PCO2ART 41.8 05/25/2019 0343   PO2ART 96.0 05/25/2019 0343   HCO3 19.4 (L) 05/25/2019 0343   TCO2 21 (L) 05/25/2019 0343   ACIDBASEDEF 7.0 (H) 05/25/2019 0343   O2SAT 97.0 05/25/2019 0343     Coagulation Profile: No results for input(s): INR, PROTIME in the last 168 hours.  Cardiac Enzymes: No results for input(s): CKTOTAL, CKMB, CKMBINDEX, TROPONINI in the last 168 hours.  HbA1C: No results found for: HGBA1C  CBG: Recent Labs  Lab 05/27/19 1515 05/27/19 1931  05/27/19 2328 05/28/19 0350 05/28/19 0716  GLUCAP 101* 101* 75 81 77   The patient is critically ill with multiple organ systems failure and requires high complexity decision making for assessment and support, frequent evaluation and titration of therapies, application of advanced monitoring technologies and extensive interpretation of multiple databases.   Critical Care Time devoted to patient care services described in this note is 38 Minutes.   Rodman Pickle, M.D. [facility] Pulmonary/Critical Care Medicine 05/28/2019 7:30 AM   Please see Amion for pager number to reach on-call Pulmonary and Critical Care Team.

## 2019-05-28 NOTE — Progress Notes (Signed)
Called pt's daughter, Herbert Seta, and updated her. All questions answered and discussed plan of care for the night.

## 2019-05-28 NOTE — Procedures (Signed)
Patient Name: Barbara Villanueva  MRN: [id number]  Epilepsy Attending: Charlsie Quest  Referring Physician/Provider:  Dr. Luciano Cutter Date: 05/28/2019 Duration: 25 mins  Patient history: 69yo F with h/o seizures who presented with ams. EEG to evaluate for seizure  Level of alertness:  Lethargic  AEDs during EEG study: LEV, VPA  Technical aspects: This EEG study was done with scalp electrodes positioned according to the 10-20 International system of electrode placement. Electrical activity was acquired at a sampling rate of 500Hz  and reviewed with a high frequency filter of 70Hz  and a low frequency filter of 1Hz . EEG data were recorded continuously and digitally stored.   DESCRIPTION:  EEG showed continuous generalized 5-7Hz  theta slowing no clear posterior dominant rhythm was seen. Hyperventilation and photic stimulation were not performed.  ABNORMALITY - Continuous slow, generalized   IMPRESSION: This study is suggestive of moderate diffuse encephalopathy, non specific to etiology.  No seizures or epileptiform discharges were seen throughout the recording.  Laelyn Blumenthal 

## 2019-05-28 NOTE — Consult Note (Addendum)
Neurology Consultation Reason for Consult: Altered mental status, tremor Referring Physician: Dr. Margaretha Seeds  CC: Altered mental status, respiratory decline, failure to thrive  History is obtained from: Chart review  HPI: Barbara Villanueva is a 69 y.o. female with history of hypothyroidism, hypertension, epilepsy who was admitted on 05/25/2019 for acute respiratory failure likely secondary to aspiration.  Patient was eventually extubated on 05/27/2019.  Neurology was consulted today due to persisting altered mental status and tremors in her hand.   Per review of note, patient was last seen at [facility] for management of epilepsy on 08/18/2016 and at that time was on Keppra 500 mg twice daily, Depakote 500 mg every morning and 1000 mg nightly as well as gabapentin 600 mg 3 times daily.  Per review of the neurology note, patient has a diagnosis of complex partial seizures, EEG on 12/12/2013 showed left temporal slowing and sharp waves.  Patient had "staring spells" as well as generalized tonic-clonic seizures.  There was also mention of possible nonepileptic events however these were not captured on EEG as patient never got ambulatory EEG that was recommended.  Of note, on exam patient was noted to have bilateral intentional tremors during this visit.  Per review of notes from critical care, patient's brother also stated that patient has had bilateral hand tremors for over a year.  ROS: unable to obtain due to altered mental status.   Unable to obtain past family history, surgical history, social history as patient is encephalopathic and unable to answer.  Exam: Current vital signs: BP 114/81   Pulse (!) 104   Temp 99.7 F (37.6 C)   Resp 16   Ht 5\' 4"  (1.626 m)   Wt 57.4 kg   SpO2 93%   BMI 21.72 kg/m  Vital signs in last 24 hours: Temp:  [98.4 F (36.9 C)-99.7 F (37.6 C)] 99.7 F (37.6 C) (03/22 1000) Pulse Rate:  [84-113] 104 (03/22 1200) Resp:  [11-30] 16 (03/22 1200) BP:  (109-156)/(52-113) 114/81 (03/22 1200) SpO2:  [87 %-100 %] 93 % (03/22 1200) FiO2 (%):  [40 %-100 %] 100 % (03/22 0840)   Physical Exam  Constitutional: Appears cachectic, not in apparent distress Psych: Difficult to assess secondary to AMS Eyes: No scleral injection, PERLA HENT: No OP obstrucion Head: Normocephalic, atraumatic Cardiovascular: Normal rate and regular rhythm.  Respiratory: On high flow nasal cannula, coarse breath sounds bilaterally GI: Soft.  No distension. Skin: Warm, no apparent ulcers  Neuro:  Mental status: Awake, alert, intermittently following simple one-step commands like raising her arm but did not stick out her tongue, could not show me 2 finger Cranial nerves: PERRLA, able to track examiner in room, blinks to threat bilaterally, no apparent facial asymmetry, difficult to assess rest of the cranial nerves as patient does not consistently follow commands Motor: Antigravity strength in all 4 extremities, possibly increased tone versus more likely resistance from patient in bilateral upper extremities, normal tone in bilateral lower extremities.  Coarse tremor worsened on movement in bilateral upper extremities Reflexes: 2+ bilaterally symmetric in bilateral upper extremities, 3+ in bilateral knee jerk, did not appreciate any ankle clonus however difficult exam minutes patient stiffens up and unable to cooperate with exam, similarly difficult to elicit plantar reflex as patient moves her foot   I have reviewed labs in epic and the results pertinent to this consultation are: Normal B12 and homocystine, MMA pending Normal cortisol levels Negative blood cultures and respiratory cultures Normal sodium, potassium, normal renal function  Leukocytosis improving, WBC 16.2 today  I have reviewed the images obtained: No brain imaging obtained since admission however per review of notes, CT head and MRI brain were performed between 3/14-3/17 at outside hospital and did not  show any acute abnormality.   ASSESSMENT/PLAN: 69 year old female with multiple medical comorbidities as well as epilepsy who presented after acute respiratory failure most likely secondary to aspiration.  Neurology was consulted due to persistent altered encephalopathy as well as bilateral hand tremors.  Acute encephalopathy Focal epilepsy Intentional tremor Hyperreflexia in bilateral lower extremities Hypothyroidism Thrombocytopenia A. fib with RVR Acute respiratory failure (improving) Failure to thrive -Patient is frail looking, able to intermittently follow some commands.  -EEG did not show any epileptiform discharges, was consistent with encephalopathy  Recommendations -As patient has had recent brain imaging and is currently on high flow nasal cannula, without any acute findings I do not feel that repeating an MRI or CT head is necessary - BL LE hyperreflexia could be secondary to spinal stenosis which again would require MRI which I doubt patient will be up able to tolerate given current respiratory status.  However, in absence of acute bowel/ bladder incontinence and other this can be pursued as an outpatient. -Gabapentin has already been discontinued by critical care service which I agree with to minimize sedation -I will also check valproic acid and ammonia levels to look for any other causes of encephalopathy - I have low suspicion for Parkinson's disease at this point given current exam findings.  Additionally, patient has had tremors which have been documented in neurology notes even in 2018.  This does not appear to be an acute issue.  It is difficult to establish a diagnosis of Parkinson's disease on an encephalopathic patient in inpatient setting.  Therefore ideally, patient would benefit from outpatient evaluation after discharge. -Continue to minimize sedation -Continue delirium precautions -Continue management of other comorbidities per primary team   Thank you for  allowing [place] to participate in the care of this patient.  Neurology will follow.  Please page neuro hospitalist for any further questions after 5 PM.  Tallin Hart Annabelle Harman

## 2019-05-29 ENCOUNTER — Inpatient Hospital Stay (HOSPITAL_COMMUNITY): Payer: Medicare PPO

## 2019-05-29 LAB — GLUCOSE, CAPILLARY
Glucose-Capillary: 89 mg/dL (ref 70–99)
Glucose-Capillary: 86 mg/dL (ref 70–99)
Glucose-Capillary: 83 mg/dL (ref 70–99)

## 2019-05-29 LAB — CBC
HCT: 27.4 % — ABNORMAL LOW (ref 36.0–46.0)
Hemoglobin: 8.8 g/dL — ABNORMAL LOW (ref 12.0–15.0)
MCH: 34.2 pg — ABNORMAL HIGH (ref 26.0–34.0)
MCHC: 32.1 g/dL (ref 30.0–36.0)
MCV: 106.6 fL — ABNORMAL HIGH (ref 80.0–100.0)
Platelets: 32 10*3/uL — ABNORMAL LOW (ref 150–400)
RBC: 2.57 MIL/uL — ABNORMAL LOW (ref 3.87–5.11)
RDW: 15.1 % (ref 11.5–15.5)
WBC: 14.4 10*3/uL — ABNORMAL HIGH (ref 4.0–10.5)
nRBC: 0.6 % — ABNORMAL HIGH (ref 0.0–0.2)

## 2019-05-29 LAB — BASIC METABOLIC PANEL
Anion gap: 6 (ref 5–15)
BUN: 20 mg/dL (ref 8–23)
CO2: 29 mmol/L (ref 22–32)
Calcium: 8.1 mg/dL — ABNORMAL LOW (ref 8.9–10.3)
Chloride: 107 mmol/L (ref 98–111)
Creatinine, Ser: 0.39 mg/dL — ABNORMAL LOW (ref 0.44–1.00)
GFR calc Af Amer: >60 mL/min (ref >60)
GFR calc non Af Amer: >60 mL/min (ref >60)
Glucose, Bld: 97 mg/dL (ref 70–99)
Potassium: 3.9 mmol/L (ref 3.5–5.1)
Sodium: 142 mmol/L (ref 135–145)

## 2019-05-29 LAB — PHOSPHORUS: Phosphorus: 2.9 mg/dL (ref 2.5–4.6)

## 2019-05-29 LAB — CULTURE, BLOOD (ROUTINE X 2)
Culture: NO GROWTH
Culture: NO GROWTH

## 2019-05-29 LAB — MAGNESIUM: Magnesium: 2.2 mg/dL (ref 1.7–2.4)

## 2019-05-29 MED ORDER — DIPHENHYDRAMINE HCL 50 MG/ML IJ SOLN: 25.0000 mg | INTRAMUSCULAR | Status: DC | PRN

## 2019-05-29 MED ORDER — HALOPERIDOL LACTATE 5 MG/ML IJ SOLN: 2.5000 mg | INTRAMUSCULAR | Status: DC | PRN

## 2019-05-29 MED ORDER — DEXTROSE 5 % IV SOLN: INTRAVENOUS | Status: DC

## 2019-05-29 MED ORDER — MIDAZOLAM HCL 2 MG/2ML IJ SOLN: 2.0000 mg | INTRAMUSCULAR | Status: DC | PRN

## 2019-05-29 MED ORDER — THIAMINE HCL 100 MG PO TABS
100.0000 mg | ORAL_TABLET | Freq: Every day | ORAL | Status: DC
  Administered 2019-05-29: 10:00:00 100 mg
  Filled 2019-05-29: qty 1

## 2019-05-29 MED ORDER — GLYCOPYRROLATE 1 MG PO TABS: 1.0000 mg | ORAL_TABLET | ORAL | Status: DC | PRN

## 2019-05-29 MED ORDER — MIDAZOLAM HCL 2 MG/2ML IJ SOLN
INTRAMUSCULAR | Status: AC
  Filled 2019-05-29: qty 2

## 2019-05-29 MED ORDER — GLYCOPYRROLATE 0.2 MG/ML IJ SOLN: 0.2000 mg | INTRAMUSCULAR | Status: DC | PRN

## 2019-05-29 MED ORDER — POLYVINYL ALCOHOL 1.4 % OP SOLN
1.0000 [drp] | Freq: Four times a day (QID) | OPHTHALMIC | Status: DC | PRN
  Filled 2019-05-29: qty 15

## 2019-05-29 MED ORDER — GLYCOPYRROLATE 0.2 MG/ML IJ SOLN
0.2000 mg | INTRAMUSCULAR | Status: DC | PRN
  Administered 2019-05-29: 0.2 mg via INTRAVENOUS
  Filled 2019-05-29 (×2): qty 1

## 2019-05-29 MED ORDER — FENTANYL CITRATE (PF) 100 MCG/2ML IJ SOLN
INTRAMUSCULAR | Status: AC
  Filled 2019-05-29: qty 2

## 2019-05-29 MED ORDER — WHITE PETROLATUM EX OINT
TOPICAL_OINTMENT | CUTANEOUS | Status: AC
  Administered 2019-05-29: 0.2
  Filled 2019-05-29: qty 28.35

## 2019-05-29 MED ORDER — ACETAMINOPHEN 325 MG PO TABS: 650.0000 mg | ORAL_TABLET | Freq: Four times a day (QID) | ORAL | Status: DC | PRN

## 2019-05-29 MED ORDER — ACETAMINOPHEN 650 MG RE SUPP: 650.0000 mg | Freq: Four times a day (QID) | RECTAL | Status: DC | PRN

## 2019-05-29 MED ORDER — OSMOLITE 1.2 CAL PO LIQD
1000.0000 mL | ORAL | Status: DC
  Filled 2019-05-29: qty 1000

## 2019-05-29 MED ORDER — MORPHINE SULFATE (PF) 2 MG/ML IV SOLN
2.0000 mg | INTRAVENOUS | Status: DC | PRN
  Administered 2019-05-29 – 2019-05-30 (×2): 2 mg via INTRAVENOUS
  Filled 2019-05-29 (×2): qty 1

## 2019-05-29 NOTE — Progress Notes (Signed)
Notified provider patient's O2 dropped to 70's while on HFNC @ 30L, FiO2 80%. FiO2 increased to 100%, HFNC increased to 60L and non-rebreather placed @ 15L. Respiratory notified and at bedside. Provider coming to assess patient.

## 2019-05-29 NOTE — Progress Notes (Signed)
NAME:  Barbara Villanueva, MRN:  [id number], DOB:  05/14/1950, LOS: 5 ADMISSION DATE:  05/24/2019, CONSULTATION DATE: 3/18 REFERRING MD: 3/18, CHIEF COMPLAINT: [facility]  Brief History   69 year old female with history of hypothyroidism, hypertension, seizure disorder.  Admitted with generalized failure to thrive over approximately 2 weeks timeframe on 3/14.  Had been making improvements and was to be transferred to skilled nursing facility on 318, however early that morning developed acute respiratory distress with complete opacification of left hemithorax resulting in need for intubation, was hypotensive post intubation.  Transferred to [facility] for further ration and treatment  History of present illness   69 year old chronically ill-appearing female with below listed comorbidities was brought to the [facility] emergency room on 3/14 by her brother with report of 2-week history of worsening anorexia, decreased p.o. intake, fatigue, increased somnolence and worsening confusion.  In the emergency room she was afebrile, sodium 141, normal BUN and creatinine.  A CT of head was obtained this was unremarkable.  She is admitted for evaluation of acute encephalopathy.  And just generalized failure to thrive therapeutic interventions have included primarily IV fluid and supportive care in addition to further neurologic evaluation which had been negative to date.  She was accidentally to be transferred to skilled nursing facility on 3/18 however overnight/in the early a.m. hours of 3/18 developed acute hypoxia, worsening work of breathing, and chest x-ray with complete opacification of the left hemithorax, she was intubated with a working diagnosis of presumed aspiration event with secondary progressive circulatory shock.  She was transferred to [facility] for further evaluation.   The patient's brother revealed thatshe has had over a years decline in health, weight, as well as cognitive  function, but this is more rapidly declined over the last 2 weeks leading to her hospitalization.  There is a family history of dementia.  Past Medical History  Hypothyroidism, hypertension, seizure disorder.  Significant Hospital Events    Admitted to [facility] 3/14, initial working diagnosis of metabolic encephalopathy, failure to thrive, hypothyroidism, weakness, and dehydration as well as protein calorie malnutrition  3/14 through 3/17: Receiving IV fluids, supportive care, extensive diagnostic evaluation without significant positive diagnostic findings specifically negative CT brain, no acute findings on MRI brain, B12, folate, and TSH all within normal limits no evidence of seizure, no evidence of acute infection.  She had made some moderate improvement, and was to be transferred to skilled nursing facility  3/18: Developed acute respiratory failure overnight, acute left-sided opacification of the left hemithorax with associated respiratory failure requiring intubation, complicated by circulatory shock, central venous line placed, initiated on vasoactive drips.  Transferred to [facility].  3/21 Extubated to HFNC  3/22 Remains on HFNC  Consults:  None  Procedures:  Oral endotracheal tube 3/18:([place])>>>3/21 Right IJ triple-lumen catheter 3/18 ([place])>>>  Significant Diagnostic Tests:   CT brain 3/14: Negative for CVA  MRI brain at [facility] 3/17: Motion artifact but no evidence of recent infarction, hemorrhage or mass minor chronic microvascular changes  EEG 3/18: study is suggestive of moderate to severe diffuse encephalopathy, non specific to etiology.  No seizures or epileptiform discharges were seen throughout the recording.  Micro Data:   Respiratory culture 3/18 - Normal flora  Blood culture 3/18  COVID-19 3/17: Negative completed at [facility] and confirmed  Antimicrobials:  Ceftriaxone and a azithromycin x1 dose 3/18 Cefepime 3/18>3/22 Vancomycin  3/18  Interim history/subjective:  Desaturated overnight. Continues to have weak cough.   Objective  Blood pressure (!) 90/57, pulse (!) 102, temperature 98.4 F (36.9 C), temperature source Oral, resp. rate 16, height 5\' 4"  (1.626 m), weight 60.8 kg, SpO2 98 %.    FiO2 (%):  [70 %-100 %] 80 %   Intake/Output Summary (Last 24 hours) at 05/29/2019 0748 Last data filed at 05/29/2019 05/31/2019 Gross per 24 hour  Intake 1210.79 ml  Output 900 ml  Net 310.79 ml   Filed Weights   05/24/19 1420 05/26/19 0500 05/29/19 0500  Weight: 47.2 kg 57.4 kg 60.8 kg   Physical Exam: General: Chronically ill and frail-appearing, no acute distress HENT: [place], AT, OP clear, MMM, wearing HFNC and NRB Eyes: EOMI, no scleral icterus Respiratory: Diminished breath sounds bilaterally.  No crackles, wheezing or rales Cardiovascular: RRR, -M/R/G, no JVD GI: BS+, soft, nontender Extremities:1+ pedal edema,-tenderness Neuro: Awake, follows commands, weak cough, moves extremities x 4, bilateral upper extremity tremor  Resolved Hospital Problem list   Septic shock secondary to aspiration pneumonia/HCAP s/p 5 days Cefepime Assessment & Plan:   Critically ill due to acute hypoxic respiratory failure in the setting of presumed aspiration pneumonia/HCAP On mechanical ventilation from 3/18-3/21. Respiratory status remains tenuous and requires monitoring. High risk for re-intubation. Discussed with family GOC due to her dementia. Family wishes to pursue intubation if needed for now but will discuss with family long-term goals. Family would definitely NOT pursue tracheostomy. -Continue HFNC for SpO2 goal >88% -Pulmonary toilet: [place]one[place], chest PT  Atrial fibrillation with RVR secondary to setting of critical illness. No recurrent episodes -Off amio since 3/21 -Hold off on anticoagulation unless recurrent  Acute toxic/metabolic encephalopathy.  Underlying dementia as cause of failure to thrive.  Parkinsonian-like tremor.  Somewhat improving -Neuro consulted to consider undiagnosed Parkinson's disease could explain her progressive decline. No inpatient work-up indicated. Would benefit from outpatient follow-up. -Hold neurotin  History of seizure disorder. Long-standing epilepsy since childhood.  Seizures apparently well controlled later in life. No evidence of ongoing seizure activity -Continue Depakote and Keppra -Hold neurotin. EEG neg.  History of hypothyroidism -Continue current Synthroid 25 mcg  Chronic thrombocytopenia Decreased to 30s -Holding on chemical DVT ppx. SCDs only -Transfuse for Plt <10K or bleeding -Consider Hem/Onc -Continue to trend CBC  Stage I pressure ulcer with discoloration over coccyx and sacrum -Air overlay mattress -Maximize nutritional status -Frequent repositioning -Wound ostomy eval  Hypoglycemia Improved -Continue TF -Check CBG q4  Hypophosphatemia -Replete as needed -Daily lab  Daily Goals Checklist  Nutrition: TF Pain/Anxiety/Delirium protocol (if indicated): -- VAP protocol (if indicated): -- DVT prophylaxis: SCDs only given thrombocytopenia GI prophylaxis: Pantoprazole Code Status: OK for intubation. No chest compressions or shocks Family Communication: Updated brother on 3/22. Daughter is available but no number in system.  Disposition: ICU.   Labs   CBC: Recent Labs  Lab 05/24/19 1513 05/24/19 1531 05/25/19 0353 05/26/19 0416 05/27/19 0356 05/28/19 0500 05/29/19 0441  WBC 4.5   < > 12.6* 20.6* 14.5* 16.2* 14.4*  NEUTROABS 2.2  --   --  13.2* 11.0* 12.5*  --   HGB 12.0   < > 10.2* 9.4* 8.7* 10.9* 8.8*  HCT 36.8   < > 30.8* 27.8* 26.5* 33.3* 27.4*  MCV 105.7*   < > 103.4* 103.0* 103.1* 104.1* 106.6*  PLT 123*   < > 68* 57* 37* 33* 32*   < > = values in this interval not displayed.    Basic Metabolic Panel: Recent Labs  Lab 05/24/19 1513 05/24/19 1531 05/25/19 0353 05/25/19 0353 05/25/19  1610 05/25/19 1635 05/26/19 0416  05/27/19 0356 05/28/19 0500 05/29/19 0441  NA 144   < > 142   < > 142  --  143 140 139 142  K 3.0*   < > 3.3*   < > 3.4*  --  3.7 4.5 4.4 3.9  CL 114*   < > 112*   < > 112*  --  114* 107 104 107  CO2 23   < > 22   < > 22  --  23 26 27 29   GLUCOSE 93   < > 136*   < > 130*  --  124* 102* 88 97  BUN 13   < > 12   < > 12  --  10 16 20 20   CREATININE 0.61   < > 0.50   < > 0.52  --  0.48 0.47 0.46 0.39*  CALCIUM 7.9*   < > 8.0*   < > 8.0*  --  8.0* 8.3* 8.7* 8.1*  MG 1.5*  --  1.9  --   --  2.0 2.0  --   --  2.2  PHOS 3.7  --  2.0*  --   --  1.9* 1.9*  --   --  2.9   < > = values in this interval not displayed.   GFR: Estimated Creatinine Clearance: 58.1 mL/min (A) (by C-G formula based on SCr of 0.39 mg/dL (L)). Recent Labs  Lab 05/24/19 1440 05/24/19 1513 05/24/19 1740 05/25/19 0353 05/26/19 0416 05/26/19 0745 05/26/19 1147 05/27/19 0356 05/28/19 0500 05/29/19 0441  PROCALCITON  --  3.30  --   --   --   --   --   --   --   --   WBC  --  4.5  --    < > 20.6*  --   --  14.5* 16.2* 14.4*  LATICACIDVEN 2.6*  --  3.8*  --   --  2.0* 2.0*  --   --   --    < > = values in this interval not displayed.    Liver Function Tests: Recent Labs  Lab 05/24/19 1513  AST 17  ALT 12  ALKPHOS 42  BILITOT 0.6  PROT 3.8*  ALBUMIN 1.8*   No results for input(s): LIPASE, AMYLASE in the last 168 hours. Recent Labs  Lab 05/28/19 1300  AMMONIA 22    ABG    Component Value Date/Time   PHART 7.275 (L) 05/25/2019 0343   PCO2ART 41.8 05/25/2019 0343   PO2ART 96.0 05/25/2019 0343   HCO3 19.4 (L) 05/25/2019 0343   TCO2 21 (L) 05/25/2019 0343   ACIDBASEDEF 7.0 (H) 05/25/2019 0343   O2SAT 97.0 05/25/2019 0343     Coagulation Profile: No results for input(s): INR, PROTIME in the last 168 hours.  Cardiac Enzymes: No results for input(s): CKTOTAL, CKMB, CKMBINDEX, TROPONINI in the last 168 hours.  HbA1C: No results found for: HGBA1C  CBG: Recent Labs  Lab 05/28/19 1458  05/28/19 1902 05/28/19 2304 05/29/19 0305 05/29/19 0742  GLUCAP 77 88 87 89 86   The patient is critically ill with multiple organ systems failure and requires high complexity decision making for assessment and support, frequent evaluation and titration of therapies, application of advanced monitoring technologies and extensive interpretation of multiple databases.   Critical Care Time devoted to patient care services described in this note is 33 Minutes.   05/31/19, M.D. [facility] Pulmonary/Critical Care Medicine 05/29/2019 7:48 AM  Please see Amion for pager number to reach on-call Pulmonary and Critical Care Team.

## 2019-05-29 NOTE — Progress Notes (Addendum)
PCCM Interval Note  I was notified by bedside RN, RT and NP regarding patient's deteriorating respiratory status. I evaluated patient at bedside and called the daughter, Barbara Villanueva, to update her on her mother's critical condition. If we wished to be aggressive, patient would warrant re-intubation. Barbara Villanueva spoke with remaining relatives and decided to change patient's code status to DNR/DNI with focus on comfort. I explained to her what comfort care entailed including giving medications for pain, anxiety and discomfort. We would also avoid procedures that would cause pain or discomfort. I will also alert case management regarding hospice. She expresses understanding and will come to the the hospital within the hour.  CC: 36 min  Mechele Collin, M.D. [facility] Pulmonary/Critical Care Medicine 05/29/2019 2:05 PM

## 2019-05-29 NOTE — Progress Notes (Signed)
Pt O2 sats decreased to upper 70's. Upon entering the room patient sounded like she had a lot of phlegm in the back of her throat. Attempted to retrieve with yankauerr but unsuccessful. Instructed patient to cough really big but cough still remains weak. Patient has been coughing more sputum up but sometimes swallows it.   Attempted to NT suction patient but not able to pass catheter through either nostril. Was able to getting some of sputum up by suctioning back of pt's throat. Applied NRB 15L and increased HHFNC to 30L/100%.  Patient now resting and sating 100%. Will titrate HHFNC back to original settings. Will leave NRB on at this time. Pt still had very congested sounding cough.

## 2019-05-29 NOTE — Progress Notes (Signed)
Pt placed on25L and 80% at this time with goals of comfort in mind.  Goal is 25L.  Neb treatment held for comfort measures.  RT will continue to monitor as needed.

## 2019-05-29 NOTE — Progress Notes (Addendum)
Initial Nutrition Assessment  DOCUMENTATION CODES:   Not applicable  INTERVENTION:    To better meet re-estimated nutrition needs, change tube feeding to Osmolite 1.2 at 55 ml/h (1320 ml per day)   Provides 1584 kcal, 73 gm protein, 1082 ml free water daily  NUTRITION DIAGNOSIS:   Inadequate oral intake related to inability to eat as evidenced by NPO status.  Ongoing  GOAL:   Patient will meet greater than or equal to 90% of their needs  Met with TF  MONITOR:   TF tolerance, Diet advancement, PO intake  REASON FOR ASSESSMENT:   Ventilator, Consult Enteral/tube feeding initiation and management  ASSESSMENT:   69 yo female admitted with acute respiratory distress, L hemithorax, requiring intubation. PMH includes hypothyroidism, HTN, seizure D/O.   Discussed patient in ICU rounds and with RN today. Patient was extubated on 3/21. Currently on 30 L HFNC. Cortrak in place; receiving Vital AF 1.2 at 40 ml/h to provide 1152 kcal, 72 gm protein, 779 ml free water daily. Tolerating TF well. No plans for swallow evaluation today, may be able tomorrow.  Labs reviewed. CBG: [id number]  Medications reviewed and include MVI, thiamine.   NUTRITION - FOCUSED PHYSICAL EXAM:  unable to complete  Diet Order:   Diet Order            Diet NPO time specified  Diet effective now              EDUCATION NEEDS:   Not appropriate for education at this time  Skin:  Skin Assessment: Skin Integrity Issues: Skin Integrity Issues:: Stage I Stage I: buttocks  Last BM:  3/23 type 6  Height:   Ht Readings from Last 1 Encounters:  05/24/19 5' 4"  (1.626 m)    Weight:   Wt Readings from Last 1 Encounters:  05/29/19 60.8 kg    Ideal Body Weight:  54.5 kg  BMI:  Body mass index is 23.01 kg/m.  Estimated Nutritional Needs:   Kcal:  1400-1600  Protein:  70-80 gm  Fluid:  1.5 L    Molli Barrows, RD, LDN, CNSC Please refer to Amion for contact information.

## 2019-05-29 NOTE — Care Management (Addendum)
CM acknowledges verbal consult from attending to discuss residential hospice facility options.  Pt's family are now visiting with pt and will contact CM once they are ready to discuss.  Bedside nurse to provide CM's number to pts daughter.   Pt currently requiring HFNC 50 liters.  As a point of reference ;  CM verified with [facility] for [facility] and maximum facility will accept is 25 liters, 15 liters for [facility].    TOC will continue to follow

## 2019-05-30 MED ORDER — LEVETIRACETAM IN NACL 500 MG/100ML IV SOLN
500.0000 mg | Freq: Two times a day (BID) | INTRAVENOUS | Status: DC
  Administered 2019-05-30: 12:00:00 500 mg via INTRAVENOUS
  Filled 2019-05-30: qty 100

## 2019-05-30 NOTE — Progress Notes (Signed)
NAME:  Barbara Villanueva, MRN:  [id number], DOB:  [birth year], LOS: 6 ADMISSION DATE:  05/24/2019, CONSULTATION DATE: 3/18 REFERRING MD: 3/18, CHIEF COMPLAINT: [facility]  Brief History   69 year old female with history of hypothyroidism, hypertension, seizure disorder.  Admitted with generalized failure to thrive over approximately 2 weeks timeframe on 3/14.  Had been making improvements and was to be transferred to skilled nursing facility on 318, however early that morning developed acute respiratory distress with complete opacification of left hemithorax resulting in need for intubation, was hypotensive post intubation.  Transferred to [facility] for further ration and treatment  History of present illness   69 year old chronically ill-appearing female with below listed comorbidities was brought to the [facility] emergency room on 3/14 by her brother with report of 2-week history of worsening anorexia, decreased p.o. intake, fatigue, increased somnolence and worsening confusion.  In the emergency room she was afebrile, sodium 141, normal BUN and creatinine.  A CT of head was obtained this was unremarkable.  She is admitted for evaluation of acute encephalopathy.  And just generalized failure to thrive therapeutic interventions have included primarily IV fluid and supportive care in addition to further neurologic evaluation which had been negative to date.  She was accidentally to be transferred to skilled nursing facility on 3/18 however overnight/in the early a.m. hours of 3/18 developed acute hypoxia, worsening work of breathing, and chest x-ray with complete opacification of the left hemithorax, she was intubated with a working diagnosis of presumed aspiration event with secondary progressive circulatory shock.  She was transferred to [facility] for further evaluation.   The patient's brother revealed thatshe has had over a years decline in health, weight, as well as cognitive  function, but this is more rapidly declined over the last 2 weeks leading to her hospitalization.  There is a family history of dementia.  Past Medical History  Hypothyroidism, hypertension, seizure disorder.  Significant Hospital Events    Admitted to [facility] 3/14, initial working diagnosis of metabolic encephalopathy, failure to thrive, hypothyroidism, weakness, and dehydration as well as protein calorie malnutrition  3/14 through 3/17: Receiving IV fluids, supportive care, extensive diagnostic evaluation without significant positive diagnostic findings specifically negative CT brain, no acute findings on MRI brain, B12, folate, and TSH all within normal limits no evidence of seizure, no evidence of acute infection.  She had made some moderate improvement, and was to be transferred to skilled nursing facility  3/18: Developed acute respiratory failure overnight, acute left-sided opacification of the left hemithorax with associated respiratory failure requiring intubation, complicated by circulatory shock, central venous line placed, initiated on vasoactive drips.  Transferred to [facility].  3/21 Extubated to [facility]  3/22 Remains on [facility]  Consults:  None  Procedures:  Oral endotracheal tube 3/18:([place])>>>3/21 Right IJ triple-lumen catheter 3/18 ([place])>>>  Significant Diagnostic Tests:   CT brain 3/14: Negative for CVA  MRI brain at [facility] 3/17: Motion artifact but no evidence of recent infarction, hemorrhage or mass minor chronic microvascular changes  EEG 3/18: study is suggestive of moderate to severe diffuse encephalopathy, non specific to etiology.  No seizures or epileptiform discharges were seen throughout the recording.  Micro Data:   Respiratory culture 3/18 - Normal flora  Blood culture 3/18  COVID-19 3/17: Negative completed at [facility] and confirmed  Antimicrobials:  Ceftriaxone and a azithromycin x1 dose 3/18 Cefepime 3/18>3/22 Vancomycin  3/18  Interim history/subjective:  Note CODE STATUS discussions held yesterday Transition to comfort care Denies pain  Objective   Blood pressure 121/61, pulse 80, temperature 98.4 F (36.9 C), temperature source Oral, resp. rate 18, height 5\' 4"  (1.626 m), weight 61.1 kg, SpO2 94 %.    FiO2 (%):  [80 %-100 %] 80 %   Intake/Output Summary (Last 24 hours) at 05/30/2019 05/22/2019 Last data filed at 05/30/2019 0745 Gross per 24 hour  Intake 448.2 ml  Output 50 ml  Net 398.2 ml   Filed Weights   05/26/19 0500 05/29/19 0500 05/30/19 0500  Weight: 57.4 kg 60.8 kg 61.1 kg   Physical Exam: General: Weak, ill-appearing, frail HENT: Oropharynx dry with some upper airway secretions heard Eyes: No icterus Respiratory: Coarse bilaterally Cardiovascular: Tachycardic, regular, distant, no murmur GI: Nondistended, hypoactive bowel sounds Extremities: 1+ pretibial edema Neuro: Wakes to voice, globally weak, able to move upper extremities, speaks in short sentences  Resolved Hospital Problem list   Septic shock secondary to aspiration pneumonia/HCAP s/p 5 days Cefepime Assessment & Plan:   Critically ill due to acute hypoxic respiratory failure in the setting of presumed aspiration pneumonia/HCAP On mechanical ventilation from 3/18-3/21.  Poor air reduction currently Wean high flow nasal cannula Moisten oropharynx, push pulmonary hygiene as able, principally for comfort Narcotics for any component of increased work of breathing Plan transition to hospice care  Atrial fibrillation with RVR secondary to setting of critical illness. No recurrent episodes Off amiodarone Anticoagulation held  Acute toxic/metabolic encephalopathy.  Underlying dementia as cause of failure to thrive.  Parkinsonian-like tremor.  Supportive care, Neurontin on hold  History of seizure disorder. Long-standing epilepsy since childhood.  Seizures apparently well controlled later in life. No evidence of ongoing seizure  activity Restart Keppra to avoid any seizure activity as we transition to comfort.  Depakote IV is on national back order.  Not clear to me that she can tolerate oral medication.  If she does develop the ability to do so then would consider starting to avoid seizures as we transition to comfort Neurontin on hold  History of hypothyroidism Synthroid discontinued  Chronic thrombocytopenia Decreased to 30s Supportive care, stop laboratory testing  Stage I pressure ulcer with discoloration over coccyx and sacrum Low-dose narcotics for comfort  Hypoglycemia Improved Stop checking CBG  Hypophosphatemia Stop checking labs  Daily Goals Checklist  Nutrition: TF Pain/Anxiety/Delirium protocol (if indicated): -- VAP protocol (if indicated): -- DVT prophylaxis: SCDs only given thrombocytopenia GI prophylaxis: Pantoprazole Code Status: DNR Family Communication: Family updated by Dr. 03-21-1979 3/23, CODE STATUS change, transition to comfort care Disposition: ICU.   Labs   CBC: Recent Labs  Lab 05/24/19 1513 05/24/19 1531 05/25/19 0353 05/26/19 0416 05/27/19 0356 05/28/19 0500 05/29/19 0441  WBC 4.5   < > 12.6* 20.6* 14.5* 16.2* 14.4*  NEUTROABS 2.2  --   --  13.2* 11.0* 12.5*  --   HGB 12.0   < > 10.2* 9.4* 8.7* 10.9* 8.8*  HCT 36.8   < > 30.8* 27.8* 26.5* 33.3* 27.4*  MCV 105.7*   < > 103.4* 103.0* 103.1* 104.1* 106.6*  PLT 123*   < > 68* 57* 37* 33* 32*   < > = values in this interval not displayed.    Basic Metabolic Panel: Recent Labs  Lab 05/24/19 1513 05/24/19 1531 05/25/19 0353 05/25/19 0353 05/25/19 0555 05/25/19 1635 05/26/19 0416 05/27/19 0356 05/28/19 0500 05/29/19 0441  NA 144   < > 142   < > 142  --  143 140 139 142  K 3.0*   < > 3.3*   < >  3.4*  --  3.7 4.5 4.4 3.9  CL 114*   < > 112*   < > 112*  --  114* 107 104 107  CO2 23   < > 22   < > 22  --  23 26 27 29   GLUCOSE 93   < > 136*   < > 130*  --  124* 102* 88 97  BUN 13   < > 12   < > 12  --  10 16  20 20   CREATININE 0.61   < > 0.50   < > 0.52  --  0.48 0.47 0.46 0.39*  CALCIUM 7.9*   < > 8.0*   < > 8.0*  --  8.0* 8.3* 8.7* 8.1*  MG 1.5*  --  1.9  --   --  2.0 2.0  --   --  2.2  PHOS 3.7  --  2.0*  --   --  1.9* 1.9*  --   --  2.9   < > = values in this interval not displayed.   GFR: Estimated Creatinine Clearance: 58.1 mL/min (A) (by C-G formula based on SCr of 0.39 mg/dL (L)). Recent Labs  Lab 05/24/19 1440 05/24/19 1513 05/24/19 1740 05/25/19 0353 05/26/19 0416 05/26/19 0745 05/26/19 1147 05/27/19 0356 05/28/19 0500 05/29/19 0441  PROCALCITON  --  3.30  --   --   --   --   --   --   --   --   WBC  --  4.5  --    < > 20.6*  --   --  14.5* 16.2* 14.4*  LATICACIDVEN 2.6*  --  3.8*  --   --  2.0* 2.0*  --   --   --    < > = values in this interval not displayed.    Liver Function Tests: Recent Labs  Lab 05/24/19 1513  AST 17  ALT 12  ALKPHOS 42  BILITOT 0.6  PROT 3.8*  ALBUMIN 1.8*   No results for input(s): LIPASE, AMYLASE in the last 168 hours. Recent Labs  Lab 05/28/19 1300  AMMONIA 22    ABG    Component Value Date/Time   PHART 7.275 (L) 05/25/2019 0343   PCO2ART 41.8 05/25/2019 0343   PO2ART 96.0 05/25/2019 0343   HCO3 19.4 (L) 05/25/2019 0343   TCO2 21 (L) 05/25/2019 0343   ACIDBASEDEF 7.0 (H) 05/25/2019 0343   O2SAT 97.0 05/25/2019 0343     Coagulation Profile: No results for input(s): INR, PROTIME in the last 168 hours.  Cardiac Enzymes: No results for input(s): CKTOTAL, CKMB, CKMBINDEX, TROPONINI in the last 168 hours.  HbA1C: No results found for: HGBA1C  CBG: Recent Labs  Lab 05/28/19 1902 05/28/19 2304 05/29/19 0305 05/29/19 0742 05/29/19 1129  GLUCAP 88 87 89 86 83    05/31/19, MD, PhD 05/30/2019, 9:25 AM [facility] [phone] or if no answer [phone]

## 2019-05-30 NOTE — Progress Notes (Signed)
Bedside report to [facility].  Anette Riedel, RN at [facility] made aware of pt's departure.  Pt's daughter updated on discharge by Dois Davenport, Secondary school teacher.

## 2019-05-30 NOTE — Progress Notes (Signed)
S/w dtr Heather mid morning.  Asking if we had heard from Hospice. Brother Eddie visited late morning.  Transitioned pt from [facility] to 4L [place] ~1530  ~1615 was advised pt had a bed @ [facility] in [place].  [phone]  ~5643 report given to accepting RN Herbert Seta  Awaiting MD to complete Discharge Order & sign portable DNR.  S/w Sam, CM regarding travel via [place] or [facility] Transport papers - waiting SW to print and bring to unit. At that time ok to call [facility]. ~1725 called [facility] to request transport.  [phone] Was advised several pick ups ahead of this pt.

## 2019-05-30 NOTE — Progress Notes (Signed)
Nutrition Brief Note  Chart reviewed. Pt now transitioning to comfort care.  No further nutrition interventions warranted at this time.  Please re-consult as needed.   Lavell Ridings, RD, LDN, CNSC Please refer to Amion for contact information.                                                          

## 2019-05-30 NOTE — TOC Initial Note (Addendum)
Transition of Care [facility]) - Initial/Assessment Note    Patient Details  Name: Barbara Villanueva MRN: [id number] Date of Birth: 04/14/1950  [facility] CM/SW Contact:    Cherylann Parr, RN Phone Number: 05/30/2019, 12:36 PM  Clinical Narrative:       CM spoke with pts daughter Herbert Seta.  Herbert Seta confirms that they are interested in a hospice home CM offered residential hospice choice - daughter chose [facility].   CM contacted agency and provided referral to [facility].  Agency to follow up with CM            [facility] pt and has a residential bed for pt today.  Pt's daughter confirms approval for transport to facility today.  Pt will transport via [facility] - transport pkg printed  to unit - unit to place DNR in packet.     Bedside nurse discussed current oxygen needs with attending; per nurse pt is now on 4 liters (last documented vital in chart is [facility] 20 liters during 1300 hour).   Attending stated that pt is stable to transport on current oxygen requirements of 4 liters [place] via [facility].   Bedside nurse to call [facility] once pt is ready for transport.     Expected Discharge Plan: Hospice Medical Facility     Patient Goals and CMS Choice     Choice offered to / list presented to : Adult Children  Expected Discharge Plan and Services Expected Discharge Plan: Hospice Medical Facility     Post Acute Care Choice: Hospice Living arrangements for the past 2 months: Single Family Home                             HH Agency: [facility] Date [facility] Agency Contacted: 05/30/19 Time HH Agency Contacted: 1236 Representative spoke with at [facility] Agency: [facility]  Prior Living Arrangements/Services Living arrangements for the past 2 months: Single Family Home                     Activities of Daily Living      Permission Sought/Granted                  Emotional Assessment              Admission diagnosis:  Aspiration  pneumonia (HCC) [J69.0] Patient Active Problem List   Diagnosis Date Noted  . Pressure injury of skin 05/25/2019  . Aspiration pneumonia (HCC) 05/24/2019   PCP:  Wilmer Floor., MD Pharmacy:   [facility] - [place], [place] - [address] [place] [address] Phone: [phone] Fax: [phone]     Social Determinants of Health (SDOH) Interventions    Readmission Risk Interventions No flowsheet data found.

## 2019-05-30 NOTE — Discharge Summary (Signed)
Physician Discharge Summary         Patient ID: Barbara Villanueva MRN: [id number] DOB/AGE: 69-Dec-1952 69 y.o.  Admit date: 05/24/2019 Discharge date: 05/30/2019  Discharge Diagnoses:   Acute respiratory failure with hypoxemia Aspiration pneumonia, healthcare associated pneumonia Septic shock Epilepsy Atrial fibrillation with RVR Acute toxic metabolic encephalopathy Parkinsonianism with associated dementia Hypertension Hypothyroidism Chronic thrombocytopenia Stage I pressure ulcer at the coccyx and sacrum, present on admission Hypoglycemia Hypophosphatemia   Discharge summary    69 year old woman with hypothyroidism, hypertension, admitted with generalized weakness, failure to thrive over approximately 2 weeks.  She had subsequent acute respiratory distress with acute opacification of her left hemithorax resulting in intubation and mechanical ventilation.  She was treated for presumed aspiration versus healthcare associated pneumonia.  She developed septic shock and was treated with IV fluids, pressors.  Course complicated by atrial fibrillation with RVR, rate controlled and improved with stabilization of her respiratory status, metabolic status.  She was able to be extubated on 3/21 and has remained on high flow nasal cannula.  She exhibited increased work of breathing, evolving recurrent respiratory failure on 3/23.  Discussions were undertaken with the patient's family regarding goals for her care.  Decision was made to defer reintubation and to instead concentrate on her comfort.  High flow oxygen was continued and her other medications were stopped.  Her Keppra was added back given her history of longstanding epilepsy (well-controlled).  Valproate was deferred as the IV formulation was on national back order.  Family expressed desire to transition Barbara Villanueva out of the hospital and into hospice care.  She will transition to [facility] on 05/30/2019.   Discharge Plan  by Active Problems    Critically ill due to acute hypoxic respiratory failure in the setting of presumed aspiration pneumonia/HCAP. Persistent hypoxemia and recurrent respiratory failure 3/23 On mechanical ventilation from 3/18-3/21. Wean high flow nasal cannula to standard nasal cannula and discharged on 4 L/min.  Wean for comfort.  Acceptable to wean to room air if she is not dyspneic Moisten oropharynx, push pulmonary hygiene as able, principally for comfort Narcotics for any component of increased work of breathing Plan transition to hospice care today 3/24  Atrial fibrillation with RVR secondary to setting of critical illness. No recurrent episodes Off amiodarone Anticoagulation stopped  Acute toxic/metabolic encephalopathy.  Underlying dementia as cause of failure to thrive.  Parkinsonian-like tremor.  Supportive care, Neurontin stopped  History of seizure disorder. Long-standing epilepsy since childhood.  Seizures apparently well controlled later in life. No evidence of ongoing seizure activity Would like to restart Depakote for discharge to prevent any evolving seizure activity given her history of epilepsy, if she can tolerate taking the medication.  Given her progressive weakness, dysphagia she may not be able to take it. Neurontin on hold  History of hypothyroidism Synthroid discontinued  Chronic thrombocytopenia Supportive care, no intervention at this time  Stage I pressure ulcer with discoloration over coccyx and sacrum Low-dose narcotics for comfort   Significant Hospital tests/ studies   CT brain 3/14: Negative for CVA  MRI brain at [facility] 3/17: Motion artifact but no evidence of recent infarction, hemorrhage or mass minor chronic microvascular changes  EEG 3/18: study issuggestive of moderate to severe diffuse encephalopathy, non specific to etiology.No seizures or epileptiform discharges were seen throughout the recording   Procedures   Oral  endotracheal tube 3/18:([place])>>>3/21 Right IJ triple-lumen catheter 3/18 ([place])>>> 3/22  Culture data/antimicrobials    Respiratory culture 3/18 -  Normal flora  Blood culture 3/18  COVID-19 3/17: Negative completed at [facility] and confirmed    Discharge Exam: BP 121/61 (BP Location: Right Arm)   Pulse 87   Temp 98.4 F (36.9 C) (Oral)   Resp 14   Ht 5\' 4"  (1.626 m)   Wt 61.1 kg   SpO2 90%   BMI 23.12 kg/m   Physical Exam: General: Weak, ill-appearing, frail HENT: Oropharynx dry with some upper airway secretions heard Eyes: No icterus Respiratory: Coarse bilaterally Cardiovascular: Tachycardic, regular, distant, no murmur GI: Nondistended, hypoactive bowel sounds Extremities: 1+ pretibial edema Neuro: Wakes to voice, globally weak, able to move upper extremities, speaks in short sentences   Labs at discharge   Lab Results  Component Value Date   CREATININE 0.39 (L) 05/29/2019   BUN 20 05/29/2019   NA 142 05/29/2019   K 3.9 05/29/2019   CL 107 05/29/2019   CO2 29 05/29/2019   Lab Results  Component Value Date   WBC 14.4 (H) 05/29/2019   HGB 8.8 (L) 05/29/2019   HCT 27.4 (L) 05/29/2019   MCV 106.6 (H) 05/29/2019   PLT 32 (L) 05/29/2019   Lab Results  Component Value Date   ALT 12 05/24/2019   AST 17 05/24/2019   ALKPHOS 42 05/24/2019   BILITOT 0.6 05/24/2019   No results found for: INR, PROTIME  Current radiological studies    DG CHEST PORT 1 VIEW  Result Date: 05/29/2019 CLINICAL DATA:  Hypoxemia. EXAM: PORTABLE CHEST 1 VIEW COMPARISON:  May 27, 2019. FINDINGS: The heart size and mediastinal contours are within normal limits. Feeding tube is seen entering stomach. Right internal jugular catheter is unchanged in position. No pneumothorax is noted. Increased bilateral perihilar and basilar opacities are noted concerning for worsening edema or pneumonia with associated pleural effusions. The visualized skeletal structures are unremarkable.  IMPRESSION: Increased bilateral perihilar and basilar opacities are noted concerning for worsening edema or pneumonia with associated pleural effusions. Electronically Signed   By: Marijo Conception M.D.   On: 05/29/2019 09:21    Disposition:  Inpatient hospice care [facility] [place]  Discharge disposition: [place] Not Defined        Allergies as of 05/30/2019   No Known Allergies     Medication List    STOP taking these medications   albuterol 108 (90 Base) MCG/ACT inhaler Commonly known as: VENTOLIN HFA   gabapentin 300 MG capsule Commonly known as: NEURONTIN   levETIRAcetam 500 MG tablet Commonly known as: KEPPRA   levothyroxine 25 MCG tablet Commonly known as: SYNTHROID   simvastatin 20 MG tablet Commonly known as: ZOCOR   traZODone 50 MG tablet Commonly known as: DESYREL     TAKE these medications   divalproex 500 MG DR tablet Commonly known as: DEPAKOTE Take 500-1,000 mg by mouth See admin instructions. Take 500 mg by mouth in the morning and 1,000 mg at bedtime        Follow-up appointment   N/A  Discharge Condition:    poor   Signed: Collene Gobble 05/30/2019, 4:25 PM

## 2019-05-31 LAB — METHYLMALONIC ACID(MMA), RND URINE
Creatinine(Crt), U: 0.94 g/L (ref 0.30–3.00)
MMA - Normalized: 0.5 umol/mmol cr (ref 0.5–3.4)
Methylmalonic Acid, Ur: 4 umol/L (ref 1.6–29.7)

## 2019-06-07 DEATH — deceased

## 2020-06-01 IMAGING — DX DG CHEST 1V PORT
2 series · 2 of 2 positions shown · non-contrast
Comparison: May 27, 2019.

CLINICAL DATA: Hypoxemia.

EXAM:
PORTABLE CHEST 1 VIEW

[chest ap (1 of 2)]
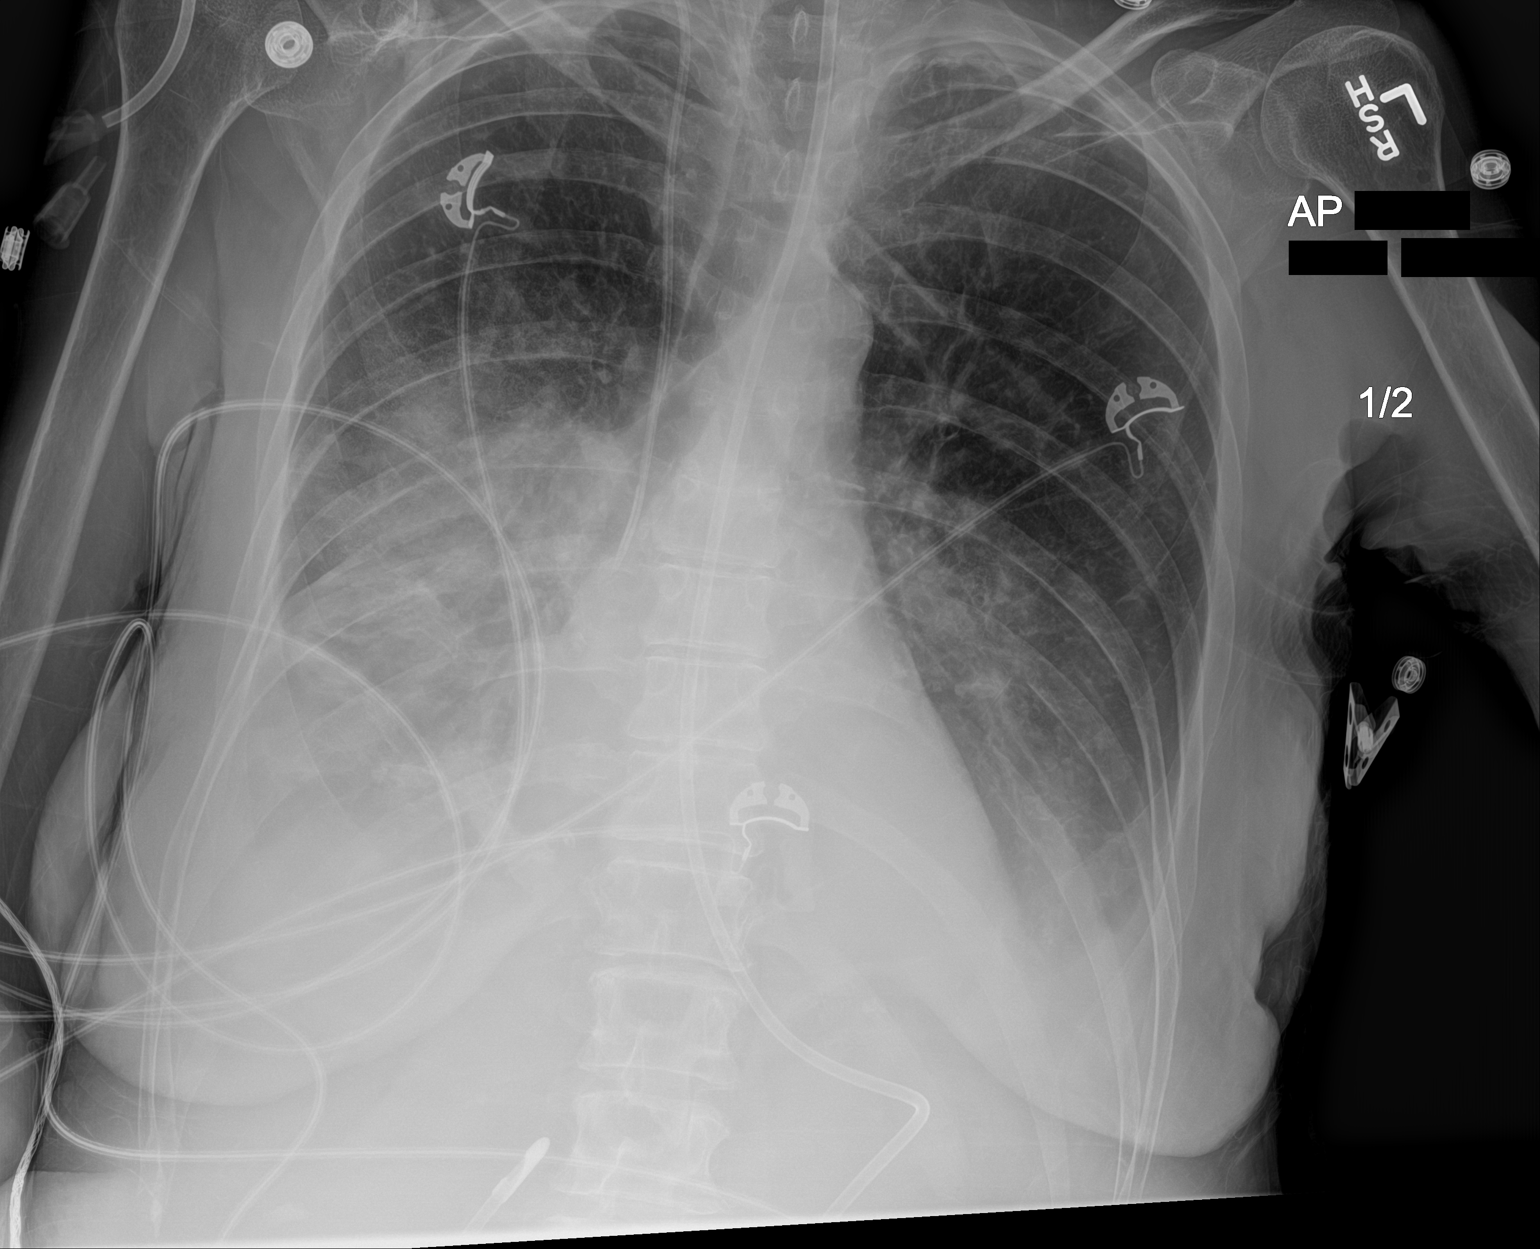

[chest ap (2 of 2)]
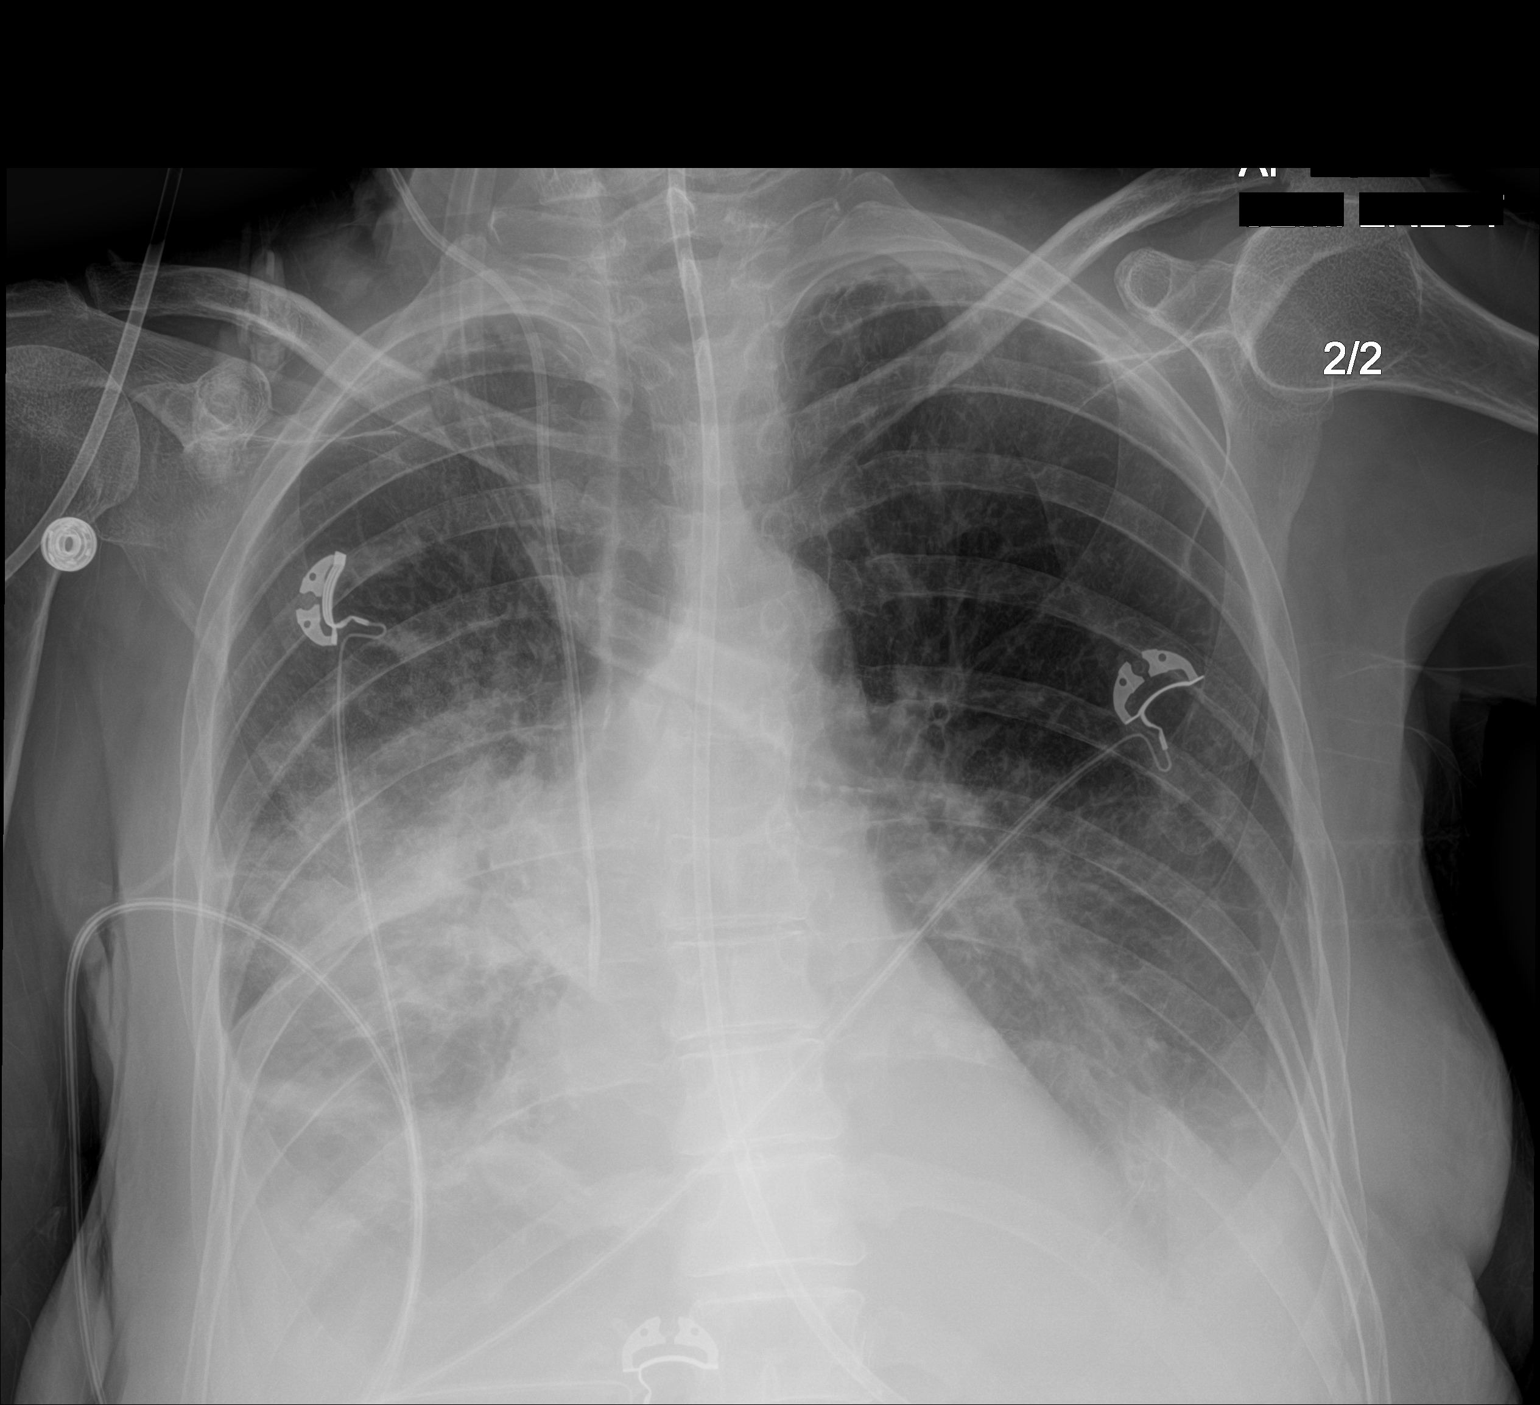

[2 of 2 positions shown; findings below may reference images not displayed]

FINDINGS: The heart size and mediastinal contours are within normal limits.
Feeding tube is seen entering stomach. Right internal jugular
catheter is unchanged in position. No pneumothorax is noted.
Increased bilateral perihilar and basilar opacities are noted
concerning for worsening edema or pneumonia with associated pleural
effusions. The visualized skeletal structures are unremarkable.
IMPRESSION: Increased bilateral perihilar and basilar opacities are noted
concerning for worsening edema or pneumonia with associated pleural
effusions.
